# Patient Record
Sex: Female | Born: 1970 | Race: White | Hispanic: No | Marital: Single | Smoking: Current every day smoker
Health system: Southern US, Community
[De-identification: ages and names within clinical notes are randomized; demographics above are authoritative.]

## PROBLEM LIST (undated history)

## (undated) DIAGNOSIS — Z01818 Encounter for other preprocedural examination: Secondary | ICD-10-CM

## (undated) HISTORY — PX: EXTERNAL FIXATION ANKLE FRACTURE: SHX1548

## (undated) HISTORY — PX: TONSILLECTOMY: SUR1361

---

## 2009-02-16 LAB — CULTURE, URINE
Culture result:: NO GROWTH
Culture: NO GROWTH

## 2009-02-16 LAB — CHLAMYDIA/GC PCR
Chlamydia amplified: NEGATIVE
N. gonorrhea, amplified: NEGATIVE

## 2010-09-27 ENCOUNTER — Encounter: Admission: RE | Admit: 2010-09-27 | Discharge: 2010-09-27 | Payer: Self-pay | Admitting: Family Medicine

## 2011-09-05 ENCOUNTER — Other Ambulatory Visit: Payer: Self-pay | Admitting: Family Medicine

## 2011-09-05 DIAGNOSIS — Z1231 Encounter for screening mammogram for malignant neoplasm of breast: Secondary | ICD-10-CM

## 2011-10-03 ENCOUNTER — Ambulatory Visit: Payer: Self-pay

## 2011-10-10 ENCOUNTER — Ambulatory Visit
Admission: RE | Admit: 2011-10-10 | Discharge: 2011-10-10 | Disposition: A | Discharge status: Home / Self Care | Payer: BC Managed Care – PPO | Source: Ambulatory Visit | Attending: Family Medicine | Admitting: Family Medicine

## 2011-10-10 DIAGNOSIS — Z1231 Encounter for screening mammogram for malignant neoplasm of breast: Secondary | ICD-10-CM

## 2012-09-30 ENCOUNTER — Other Ambulatory Visit: Payer: Self-pay | Admitting: Family Medicine

## 2012-09-30 DIAGNOSIS — Z1231 Encounter for screening mammogram for malignant neoplasm of breast: Secondary | ICD-10-CM

## 2012-10-29 ENCOUNTER — Ambulatory Visit
Admission: RE | Admit: 2012-10-29 | Discharge: 2012-10-29 | Disposition: A | Discharge status: Home / Self Care | Payer: BC Managed Care – PPO | Source: Ambulatory Visit | Attending: Family Medicine | Admitting: Family Medicine

## 2012-10-29 DIAGNOSIS — Z1231 Encounter for screening mammogram for malignant neoplasm of breast: Secondary | ICD-10-CM

## 2013-10-07 ENCOUNTER — Other Ambulatory Visit: Payer: Self-pay

## 2013-10-07 DIAGNOSIS — Z1231 Encounter for screening mammogram for malignant neoplasm of breast: Secondary | ICD-10-CM

## 2013-11-04 ENCOUNTER — Ambulatory Visit
Admission: RE | Admit: 2013-11-04 | Discharge: 2013-11-04 | Disposition: A | Discharge status: Home / Self Care | Payer: BC Managed Care – PPO | Source: Ambulatory Visit

## 2013-11-04 DIAGNOSIS — Z1231 Encounter for screening mammogram for malignant neoplasm of breast: Secondary | ICD-10-CM

## 2014-09-29 ENCOUNTER — Other Ambulatory Visit: Payer: Self-pay

## 2014-09-29 DIAGNOSIS — Z1231 Encounter for screening mammogram for malignant neoplasm of breast: Secondary | ICD-10-CM

## 2014-11-10 ENCOUNTER — Encounter (INDEPENDENT_AMBULATORY_CARE_PROVIDER_SITE_OTHER): Payer: Self-pay

## 2014-11-10 ENCOUNTER — Ambulatory Visit
Admission: RE | Admit: 2014-11-10 | Discharge: 2014-11-10 | Disposition: A | Discharge status: Home / Self Care | Payer: BC Managed Care – PPO | Source: Ambulatory Visit

## 2014-11-10 DIAGNOSIS — Z1231 Encounter for screening mammogram for malignant neoplasm of breast: Secondary | ICD-10-CM

## 2015-10-12 ENCOUNTER — Other Ambulatory Visit: Payer: Self-pay

## 2015-10-12 DIAGNOSIS — Z1231 Encounter for screening mammogram for malignant neoplasm of breast: Secondary | ICD-10-CM

## 2015-11-16 ENCOUNTER — Ambulatory Visit
Admission: RE | Admit: 2015-11-16 | Discharge: 2015-11-16 | Disposition: A | Discharge status: Home / Self Care | Payer: 59 | Source: Ambulatory Visit

## 2015-11-16 DIAGNOSIS — Z1231 Encounter for screening mammogram for malignant neoplasm of breast: Secondary | ICD-10-CM

## 2016-09-25 ENCOUNTER — Inpatient Hospital Stay: Admit: 2016-09-25 | Payer: PRIVATE HEALTH INSURANCE | Primary: Family Medicine

## 2016-09-25 ENCOUNTER — Encounter

## 2016-09-25 DIAGNOSIS — Z01818 Encounter for other preprocedural examination: Secondary | ICD-10-CM

## 2016-09-25 LAB — TYPE & SCREEN
ABO/Rh(D): O POS
Antibody screen: NEGATIVE

## 2016-09-25 LAB — SENTARA SPECIMEN COLLN.

## 2016-09-25 LAB — TYPE AND SCREEN
ABO/Rh: O POS
Antibody Screen: NEGATIVE

## 2016-09-25 NOTE — Other (Signed)
PAT - SURGICAL PRE-ADMISSION INSTRUCTIONS    NAME:  Melinda Scott                                                          TODAY'S DATE:  09/25/2016    SURGERY DATE:  10/04/2016                                  SURGERY ARRIVAL TIME:   0630    1. Do NOT eat or drink anything, including candy or gum, after MIDNIGHT on 10/03/16 , unless you have specific instructions from your Surgeon or Anesthesia Provider to do so.  2. No smoking on the day of surgery.  3. No alcohol 24 hours prior to the day of surgery.  4. No recreational drugs for one week prior to the day of surgery.  5. Leave all valuables, including money/purse, at home.  6. Remove all jewelry, nail polish, makeup (including mascara); no lotions, powders, deodorant, or perfume/cologne/after shave.  7. Glasses/Contact lenses and Dentures may be worn to the hospital.  They will be removed prior to surgery.  8. Call your doctor if symptoms of a cold or illness develop within 24 ours prior to surgery.  9. AN ADULT MUST DRIVE YOU HOME AFTER OUTPATIENT SURGERY.   10. If you are having an OUTPATIENT procedure, please make arrangements for a responsible adult to be with you for 24 hours after your surgery.  11. If you are admitted to the hospital, you will be assigned to a bed after surgery is complete.  Normally a family member will not be able to see you until you are in your assigned bed.  12. Family is encouraged to accompany you to the hospital.  We ask visitors in the treatment area to be limited to ONE person at a time to ensure patient privacy.  EXCEPTIONS WILL BE MADE AS NEEDED.  13. Children under 12 are discouraged from entering the treatment area and need to be supervised by an adult when in the waiting room.    Special Instructions:    NONE.    Patient Prep:    use CHG solution    These surgical instructions were reviewed with [name] during the PAT visit .  A printed [facility] of the instructions [name].     Directions:  On the morning of surgery, please go to the [facility].  Enter the building from the [address] entrance, 1st floor (next to the Emergency Room entrance).  Take the elevator to the 2nd floor.  Sign in at the Registration Desk.    If you have any questions and/or concerns, please do not hesitate to call:  (Prior to the day of surgery)  [facility] unit:  [phone]  (Day of surgery)  [facility]  [phone]

## 2016-09-26 LAB — EKG, 12 LEAD, INITIAL
Atrial Rate: 71 {beats}/min
Calculated P Axis: 57 degrees
Calculated R Axis: 65 degrees
Calculated T Axis: 57 degrees
P-R Interval: 110 ms
Q-T Interval: 412 ms
QRS Duration: 90 ms
QTC Calculation (Bezet): 447 ms
Ventricular Rate: 71 {beats}/min

## 2016-09-26 LAB — EKG 12-LEAD
Atrial Rate: 71 {beats}/min
P Axis: 57 degrees
P-R Interval: 110 ms
Q-T Interval: 412 ms
QRS Duration: 90 ms
QTc Calculation (Bazett): 447 ms
R Axis: 65 degrees
T Axis: 57 degrees
Ventricular Rate: 71 {beats}/min

## 2016-10-04 ENCOUNTER — Ambulatory Visit: Admit: 2016-10-04 | Payer: PRIVATE HEALTH INSURANCE | Primary: Family Medicine

## 2016-10-04 ENCOUNTER — Observation Stay
Admit: 2016-10-04 | Discharge: 2016-10-05 | Disposition: A | Discharge status: Home / Self Care | Payer: PRIVATE HEALTH INSURANCE | Attending: Neurological Surgery | Admitting: Neurological Surgery

## 2016-10-04 DIAGNOSIS — M50222 Other cervical disc displacement at C5-C6 level: Secondary | ICD-10-CM

## 2016-10-04 MED ORDER — CEFAZOLIN 2 GM/50 ML IN DEXTROSE (ISO-OSMOTIC) IVPB
2 gram/50 mL | Freq: Three times a day (TID) | INTRAVENOUS | Status: AC
Start: 2016-10-04 — End: 2016-10-05
  Administered 2016-10-04 – 2016-10-05 (×3): via INTRAVENOUS

## 2016-10-04 MED ORDER — LIDOCAINE 4 % TOPICAL SOLN
4 % | LARYNGEAL | Status: AC
Start: 2016-10-04 — End: ?

## 2016-10-04 MED ORDER — PROPOFOL 10 MG/ML IV EMUL
10 mg/mL | INTRAVENOUS | Status: DC | PRN
Start: 2016-10-04 — End: 2016-10-04
  Administered 2016-10-04 (×7): via INTRAVENOUS

## 2016-10-04 MED ORDER — ONDANSETRON (PF) 4 MG/2 ML INJECTION
4 mg/2 mL | Freq: Once | INTRAMUSCULAR | Status: DC | PRN
Start: 2016-10-04 — End: 2016-10-04

## 2016-10-04 MED ORDER — FAMOTIDINE 20 MG TAB
20 mg | Freq: Once | ORAL | Status: AC
Start: 2016-10-04 — End: 2016-10-04
  Administered 2016-10-04: 11:00:00 via ORAL

## 2016-10-04 MED ORDER — ALBUTEROL SULFATE 0.083 % (0.83 MG/ML) SOLN FOR INHALATION
2.5 mg /3 mL (0.083 %) | RESPIRATORY_TRACT | Status: DC | PRN
Start: 2016-10-04 — End: 2016-10-05
  Administered 2016-10-05: 02:00:00 via RESPIRATORY_TRACT

## 2016-10-04 MED ORDER — ONDANSETRON (PF) 4 MG/2 ML INJECTION
4 mg/2 mL | INTRAMUSCULAR | Status: DC | PRN
Start: 2016-10-04 — End: 2016-10-04
  Administered 2016-10-04: 15:00:00 via INTRAVENOUS

## 2016-10-04 MED ORDER — REMIFENTANIL 1 MG IV SOLR
1 mg | INTRAVENOUS | Status: DC | PRN
Start: 2016-10-04 — End: 2016-10-04
  Administered 2016-10-04 (×3): via INTRAVENOUS

## 2016-10-04 MED ORDER — HYDROMORPHONE (PF) 1 MG/ML IJ SOLN
1 mg/mL | INTRAMUSCULAR | Status: DC | PRN
Start: 2016-10-04 — End: 2016-10-04
  Administered 2016-10-04 (×2): via INTRAVENOUS

## 2016-10-04 MED ORDER — VANCOMYCIN 1,000 MG IV SOLR
1000 mg | INTRAVENOUS | Status: AC
Start: 2016-10-04 — End: ?

## 2016-10-04 MED ORDER — HYDROMORPHONE 2 MG/ML INJECTION SOLUTION
2 mg/mL | INTRAMUSCULAR | Status: AC
Start: 2016-10-04 — End: 2016-10-04
  Administered 2016-10-04: 15:00:00

## 2016-10-04 MED ORDER — D5-1/2 NS & POTASSIUM CHLORIDE 20 MEQ/L IV
20 mEq/L | INTRAVENOUS | Status: DC
Start: 2016-10-04 — End: 2016-10-05
  Administered 2016-10-04: 18:00:00 via INTRAVENOUS

## 2016-10-04 MED ORDER — LACTATED RINGERS IV
INTRAVENOUS | Status: DC
Start: 2016-10-04 — End: 2016-10-04
  Administered 2016-10-04: 15:00:00 via INTRAVENOUS

## 2016-10-04 MED ORDER — THROMBIN (BOVINE) 5,000 UNIT TOPICAL SOLUTION
5000 unit | CUTANEOUS | Status: DC | PRN
Start: 2016-10-04 — End: 2016-10-04
  Administered 2016-10-04: 14:00:00 via TOPICAL

## 2016-10-04 MED ORDER — GELATIN ADSORBABLE MUCOSAL POWDER
Status: AC
Start: 2016-10-04 — End: ?

## 2016-10-04 MED ORDER — ALBUTEROL SULFATE HFA 90 MCG/ACTUATION AEROSOL INHALER
90 mcg/actuation | RESPIRATORY_TRACT | Status: DC | PRN
Start: 2016-10-04 — End: 2016-10-04

## 2016-10-04 MED ORDER — SODIUM CHLORIDE 0.9 % INJECTION
INTRAMUSCULAR | Status: AC
Start: 2016-10-04 — End: ?

## 2016-10-04 MED ORDER — MIDAZOLAM 1 MG/ML IJ SOLN
1 mg/mL | INTRAMUSCULAR | Status: DC | PRN
Start: 2016-10-04 — End: 2016-10-04
  Administered 2016-10-04: 13:00:00 via INTRAVENOUS

## 2016-10-04 MED ORDER — THROMBIN (BOVINE) 5,000 UNIT TOPICAL SOLUTION
5000 unit | CUTANEOUS | Status: AC
Start: 2016-10-04 — End: ?

## 2016-10-04 MED ORDER — SODIUM CHLORIDE 0.9 % IJ SYRG
Freq: Three times a day (TID) | INTRAMUSCULAR | Status: DC
Start: 2016-10-04 — End: 2016-10-05
  Administered 2016-10-05 (×2): via INTRAVENOUS

## 2016-10-04 MED ORDER — ONDANSETRON (PF) 4 MG/2 ML INJECTION
4 mg/2 mL | INTRAMUSCULAR | Status: AC
Start: 2016-10-04 — End: ?

## 2016-10-04 MED ORDER — PROPOFOL 10 MG/ML IV EMUL
10 mg/mL | INTRAVENOUS | Status: AC
Start: 2016-10-04 — End: ?

## 2016-10-04 MED ORDER — DEXAMETHASONE SODIUM PHOSPHATE 4 MG/ML IJ SOLN
4 mg/mL | INTRAMUSCULAR | Status: DC | PRN
Start: 2016-10-04 — End: 2016-10-04
  Administered 2016-10-04: 13:00:00 via INTRAVENOUS

## 2016-10-04 MED ORDER — LACTATED RINGERS IV
INTRAVENOUS | Status: DC
Start: 2016-10-04 — End: 2016-10-04
  Administered 2016-10-04 (×2): via INTRAVENOUS

## 2016-10-04 MED ORDER — SUCCINYLCHOLINE CHLORIDE 100 MG/5 ML (20 MG/ML) IV SYRINGE
100 mg/5 mL (20 mg/mL) | INTRAVENOUS | Status: AC
Start: 2016-10-04 — End: ?

## 2016-10-04 MED ORDER — DEXAMETHASONE SODIUM PHOSPHATE 4 MG/ML IJ SOLN
4 mg/mL | INTRAMUSCULAR | Status: AC
Start: 2016-10-04 — End: ?

## 2016-10-04 MED ORDER — MIDAZOLAM 1 MG/ML IJ SOLN
1 mg/mL | INTRAMUSCULAR | Status: AC
Start: 2016-10-04 — End: ?

## 2016-10-04 MED ORDER — CYCLOBENZAPRINE 10 MG TAB
10 mg | Freq: Three times a day (TID) | ORAL | Status: DC | PRN
Start: 2016-10-04 — End: 2016-10-05
  Administered 2016-10-04: 22:00:00 via ORAL

## 2016-10-04 MED ORDER — ACETAMINOPHEN 325 MG TABLET
325 mg | ORAL | Status: DC | PRN
Start: 2016-10-04 — End: 2016-10-05

## 2016-10-04 MED ORDER — LIDOCAINE (PF) 20 MG/ML (2 %) IJ SOLN
20 mg/mL (2 %) | INTRAMUSCULAR | Status: DC | PRN
Start: 2016-10-04 — End: 2016-10-04
  Administered 2016-10-04: 13:00:00 via INTRAVENOUS

## 2016-10-04 MED ORDER — LIDOCAINE HCL 1 % (10 MG/ML) IJ SOLN
10 mg/mL (1 %) | INTRAMUSCULAR | Status: DC | PRN
Start: 2016-10-04 — End: 2016-10-04

## 2016-10-04 MED ORDER — SUCCINYLCHOLINE CHLORIDE 20 MG/ML INJECTION
20 mg/mL | INTRAMUSCULAR | Status: DC | PRN
Start: 2016-10-04 — End: 2016-10-04
  Administered 2016-10-04: 13:00:00 via INTRAVENOUS

## 2016-10-04 MED ORDER — HYDROMORPHONE (PF) 1 MG/ML IJ SOLN
1 mg/mL | INTRAMUSCULAR | Status: DC | PRN
Start: 2016-10-04 — End: 2016-10-04
  Administered 2016-10-04 (×3): via INTRAVENOUS

## 2016-10-04 MED ORDER — LIDOCAINE-EPINEPHRINE 1 %-1:100,000 IJ SOLN
1 %-:00,000 | INTRAMUSCULAR | Status: DC | PRN
Start: 2016-10-04 — End: 2016-10-04
  Administered 2016-10-04: 14:00:00 via INTRADERMAL

## 2016-10-04 MED ORDER — DOCUSATE SODIUM 100 MG CAP
100 mg | Freq: Two times a day (BID) | ORAL | Status: DC
Start: 2016-10-04 — End: 2016-10-05
  Administered 2016-10-04 – 2016-10-05 (×2): via ORAL

## 2016-10-04 MED ORDER — HYDROMORPHONE (PF) 1 MG/ML IJ SOLN
1 mg/mL | INTRAMUSCULAR | Status: AC
Start: 2016-10-04 — End: ?

## 2016-10-04 MED ORDER — SODIUM CHLORIDE 0.9 % IJ SYRG
INTRAMUSCULAR | Status: DC | PRN
Start: 2016-10-04 — End: 2016-10-05

## 2016-10-04 MED ORDER — OXYCODONE-ACETAMINOPHEN 5 MG-325 MG TAB
5-325 mg | ORAL | Status: DC | PRN
Start: 2016-10-04 — End: 2016-10-05
  Administered 2016-10-04 – 2016-10-05 (×4): via ORAL

## 2016-10-04 MED ORDER — GELATIN ADSORBABLE MUCOSAL POWDER
Status: DC | PRN
Start: 2016-10-04 — End: 2016-10-04
  Administered 2016-10-04: 14:00:00 via TOPICAL

## 2016-10-04 MED ORDER — CEFAZOLIN 2 GM/50 ML IN DEXTROSE (ISO-OSMOTIC) IVPB
2 gram/50 mL | INTRAVENOUS | Status: AC
Start: 2016-10-04 — End: 2016-10-04
  Administered 2016-10-04: 13:00:00 via INTRAVENOUS

## 2016-10-04 MED ORDER — ONDANSETRON (PF) 4 MG/2 ML INJECTION
4 mg/2 mL | INTRAMUSCULAR | Status: DC | PRN
Start: 2016-10-04 — End: 2016-10-05

## 2016-10-04 MED ORDER — DEXAMETHASONE SODIUM PHOSPHATE 4 MG/ML IJ SOLN
4 mg/mL | Freq: Three times a day (TID) | INTRAMUSCULAR | Status: AC
Start: 2016-10-04 — End: 2016-10-05
  Administered 2016-10-04 – 2016-10-05 (×3): via INTRAVENOUS

## 2016-10-04 MED ORDER — MORPHINE 10 MG/ML INJ SOLUTION
10 mg/ml | INTRAMUSCULAR | Status: DC | PRN
Start: 2016-10-04 — End: 2016-10-05

## 2016-10-04 MED ORDER — FENTANYL CITRATE (PF) 50 MCG/ML IJ SOLN
50 mcg/mL | INTRAMUSCULAR | Status: DC | PRN
Start: 2016-10-04 — End: 2016-10-04
  Administered 2016-10-04 (×2): via INTRAVENOUS

## 2016-10-04 MED ORDER — PROPOFOL 10 MG/ML IV EMUL
10 mg/mL | INTRAVENOUS | Status: DC | PRN
Start: 2016-10-04 — End: 2016-10-04
  Administered 2016-10-04 (×5): via INTRAVENOUS

## 2016-10-04 MED ORDER — BACITRACIN 50,000 UNIT IM
50000 unit | INTRAMUSCULAR | Status: AC
Start: 2016-10-04 — End: ?

## 2016-10-04 MED ORDER — LIDOCAINE-EPINEPHRINE 1 %-1:100,000 IJ SOLN
1 %-:00,000 | INTRAMUSCULAR | Status: AC
Start: 2016-10-04 — End: ?

## 2016-10-04 MED ORDER — FENTANYL CITRATE (PF) 50 MCG/ML IJ SOLN
50 mcg/mL | INTRAMUSCULAR | Status: AC
Start: 2016-10-04 — End: ?

## 2016-10-04 MED ORDER — SODIUM CHLORIDE 0.9 % IRRIGATION SOLN
0.9 % | Status: DC | PRN
Start: 2016-10-04 — End: 2016-10-04
  Administered 2016-10-04: 14:00:00

## 2016-10-04 MED ORDER — REMIFENTANIL 1 MG IV SOLR
1 mg | INTRAVENOUS | Status: AC
Start: 2016-10-04 — End: ?

## 2016-10-04 MED ORDER — MEPERIDINE (PF) 25 MG/ML INJ SOLUTION
25 mg/ml | INTRAMUSCULAR | Status: DC | PRN
Start: 2016-10-04 — End: 2016-10-04

## 2016-10-04 MED FILL — CYCLOBENZAPRINE 10 MG TAB: 10 mg | ORAL | Qty: 1

## 2016-10-04 MED FILL — LIDOCAINE-EPINEPHRINE 1 %-1:100,000 IJ SOLN: 1 %-:00,000 | INTRAMUSCULAR | Qty: 50

## 2016-10-04 MED FILL — LACTATED RINGERS IV: INTRAVENOUS | Qty: 1000

## 2016-10-04 MED FILL — OXYCODONE-ACETAMINOPHEN 5 MG-325 MG TAB: 5-325 mg | ORAL | Qty: 1

## 2016-10-04 MED FILL — SUCCINYLCHOLINE CHLORIDE 100 MG/5 ML (20 MG/ML) IV SYRINGE: 100 mg/5 mL (20 mg/mL) | INTRAVENOUS | Qty: 5

## 2016-10-04 MED FILL — ONDANSETRON (PF) 4 MG/2 ML INJECTION: 4 mg/2 mL | INTRAMUSCULAR | Qty: 2

## 2016-10-04 MED FILL — BD POSIFLUSH NORMAL SALINE 0.9 % INJECTION SYRINGE: INTRAMUSCULAR | Qty: 10

## 2016-10-04 MED FILL — LIDOCAINE 4 % TOPICAL SOLN: 4 % | LARYNGEAL | Qty: 4

## 2016-10-04 MED FILL — PROPOFOL 10 MG/ML IV EMUL: 10 mg/mL | INTRAVENOUS | Qty: 50

## 2016-10-04 MED FILL — QUELICIN 20 MG/ML INJECTION SOLUTION: 20 mg/mL | INTRAMUSCULAR | Qty: 4

## 2016-10-04 MED FILL — DEXAMETHASONE SODIUM PHOSPHATE 4 MG/ML IJ SOLN: 4 mg/mL | INTRAMUSCULAR | Qty: 1

## 2016-10-04 MED FILL — GELFOAM MUCOSAL POWDER: Qty: 3

## 2016-10-04 MED FILL — VANCOMYCIN 1,000 MG IV SOLR: 1000 mg | INTRAVENOUS | Qty: 1000

## 2016-10-04 MED FILL — CEFAZOLIN 2 GM/50 ML IN DEXTROSE (ISO-OSMOTIC) IVPB: 2 gram/50 mL | INTRAVENOUS | Qty: 50

## 2016-10-04 MED FILL — PROPOFOL 10 MG/ML IV EMUL: 10 mg/mL | INTRAVENOUS | Qty: 350

## 2016-10-04 MED FILL — ULTIVA 1 MG INTRAVENOUS SOLUTION: 1 mg | INTRAVENOUS | Qty: 0.4

## 2016-10-04 MED FILL — MIDAZOLAM 1 MG/ML IJ SOLN: 1 mg/mL | INTRAMUSCULAR | Qty: 2

## 2016-10-04 MED FILL — HYDROMORPHONE 2 MG/ML INJECTION SOLUTION: 2 mg/mL | INTRAMUSCULAR | Qty: 1

## 2016-10-04 MED FILL — D5-1/2 NS & POTASSIUM CHLORIDE 20 MEQ/L IV: 20 mEq/L | INTRAVENOUS | Qty: 1000

## 2016-10-04 MED FILL — BACITRACIN 50,000 UNIT IM: 50000 unit | INTRAMUSCULAR | Qty: 50000

## 2016-10-04 MED FILL — DOCUSATE SODIUM 100 MG CAP: 100 mg | ORAL | Qty: 1

## 2016-10-04 MED FILL — DIPRIVAN 10 MG/ML INTRAVENOUS EMULSION: 10 mg/mL | INTRAVENOUS | Qty: 869.82

## 2016-10-04 MED FILL — SODIUM CHLORIDE 0.9 % INJECTION: INTRAMUSCULAR | Qty: 10

## 2016-10-04 MED FILL — THROMBIN-JMI 5,000 UNIT TOPICAL SOLUTION: 5000 unit | CUTANEOUS | Qty: 30000

## 2016-10-04 MED FILL — LACTATED RINGERS IV: INTRAVENOUS | Qty: 700

## 2016-10-04 MED FILL — LIDOCAINE (PF) 20 MG/ML (2 %) IJ SOLN: 20 mg/mL (2 %) | INTRAMUSCULAR | Qty: 5

## 2016-10-04 MED FILL — HYDROMORPHONE (PF) 1 MG/ML IJ SOLN: 1 mg/mL | INTRAMUSCULAR | Qty: 1

## 2016-10-04 MED FILL — DEXAMETHASONE SODIUM PHOSPHATE 4 MG/ML IJ SOLN: 4 mg/mL | INTRAMUSCULAR | Qty: 10

## 2016-10-04 MED FILL — PROPOFOL 10 MG/ML IV EMUL: 10 mg/mL | INTRAVENOUS | Qty: 20

## 2016-10-04 MED FILL — ULTIVA 1 MG INTRAVENOUS SOLUTION: 1 mg | INTRAVENOUS | Qty: 2

## 2016-10-04 MED FILL — FAMOTIDINE 20 MG TAB: 20 mg | ORAL | Qty: 1

## 2016-10-04 MED FILL — FENTANYL CITRATE (PF) 50 MCG/ML IJ SOLN: 50 mcg/mL | INTRAMUSCULAR | Qty: 2

## 2016-10-04 NOTE — Op Note (Signed)
[facility]  OPERATIVE RE[facility]    Name:  Melinda JeffersonMILLER, Melinda  MR#:  [id number]  DOB:  04-25-1971  Account #:  [id number]  Date of Adm:  10/04/2016  Date of Surgery:  10/04/2016      PREOPERATIVE DIAGNOSIS: C5-C6 disk herniation and stenosis.    POSTOPERATIVE DIAGNOSIS: C5-C6 disk herniation and stenosis.    PROCEDURES PERFORMED  1. C5-C6 anterior cervical diskectomy and fusion.  2. Insertion of PEEK interbody spacer.  3. Anterior plating Globus XTEND plate.  4. Use of Signafuse allograft.    SURGEON: Cyndie ChimeJoseph Katherine Tout, MD.    ANESTHESIA: General orotracheal.    ESTIMATED BLOOD LOSS: Minimal.    SPECIMENS REMOVED: -    COMPLICATIONS: None.    BRIEF CLINICAL NOTE: This is a 45 year old lady with progressively  symptomatic C5-6 stenosis, left greater than right symptoms. She  failed conservative therapy and requested to undergo the above  procedure.    PROCEDURE IN DETAIL: After informed consent was given, the  patient was brought to the operating room and underwent the induction  of general orotracheal anesthesia without difficulty. Following this, she  was carefully positioned supine upon the operating table with all  pressure points carefully padded. Neural monitoring electrodes were  placed. The head was maintained in the Aesculap head holder.  Posterior neck support was placed. The shoulders were gently taped  down inferiorly, localizing x-ray was done and a horizontal incision on  the left side of the neck was outlined.    We prepped and draped in the usual sterile fashion. Local anesthetic  was infiltrated and the incision was opened. The platysma was divided.  The middle layers of cervical fascia were bluntly and sharply dissected.  The prevertebral fascia was spread with Kitner dissectors and a spinal  needle was placed in the C5-6 disk and confirmed fluoroscopically and  marked with the Bovie cautery.    The medial edges of longus colli muscle were cauterized and the  retractor blades were placed  here under the muscle; 12 mm Caspar  distraction pins were placed.    Diskectomy was carried out with curettes, pituitary rongeurs and Midas  Rex drill. Osteophytes were drilled down. There was quite severe  foraminal stenosis on the left and with 2 mm Kerrison rongeur after  undermining posterior longitudinal ligament and removing all it, wide  foraminotomies were performed bilaterally.    Once a complete neurologic decompression was carried out. The  endplates were prepared. We trialed 8 mm, a PEEK spacer was  packed Signafuse allograft and tapped into place with good purchase  and position.    Globus XTEND anterior plate 14 mm was then chosen, 12 mm screws  were used, variable in C5, fixed angle in C6 and they were placed and  locked with good purchase and position. Final x-rays confirmed good  position of the hardware and the implant.    The wound was irrigated and hemostasis was obtained. We closed in  layers with vancomycin powder, 3-0 Vicryl, 4-0 Monocryl and  Dermabond on the skin. All sponge and needle counts were correct at  the end of the case and the patient was extubated in the operating  room, taken to recovery in stable condition.        Cyndie ChimeJOSEPH  Dawnetta Copenhaver, MD    Melvenia NeedlesJK / DLR  D:  10/04/2016   10:59  T:  10/04/2016   12:46  Job #:  [id number]

## 2016-10-04 NOTE — Consults (Signed)
Consults  by Jillyn Ledger, NP at 10/04/16 1751                Author: Jillyn Ledger, NP  Service: Nurse Practitioner  Author Type: Nurse Practitioner       Filed: 10/04/16 1758  Date of Service: 10/04/16 1751  Status: Attested           Editor: Shanvi Moyd, Debbora Lacrosse, NP (Nurse Practitioner)  Cosigner: Gevena Barre, MD at 10/04/16 1912            Consult Orders        1. IP CONSULT TO HOSPITALIST [[id number]] ordered by Bethann Goo, MD at 10/04/16 1056                         Attestation signed by Gevena Barre, MD at 10/04/16 1912          I have interviewed the patient on my own and obtained my own physical.  I have discussed management with NP Mala Gibbard and we are working collaboratively to formulate  and execute her care plan.      I agree with the Consult as recorded by NP Marlis Oldaker.                                      [facility]   Hospitalist Division      Consult Note          Patient: Melinda Scott  MRN: [id number]   CSN: [id number]          Date of Birth: 07-16-1971   Age: 45 y.o.   Sex: female      DOA: 10/04/2016  LOS:  LOS: 0 days            Requesting Physician:  Dr. Su Ley   Reason for Consultation:  Medical Management      Chief Complaint:  Cervical disc         Assessment/Plan          Patient Active Problem List        Diagnosis  Code         ?  Cervical disc disease  M50.90           A/P:   - S/p C5-C6 anterior cervical diskectomy and fusion: mobilization per Neurosurgery    - Asthma: Albuterol PRN    - Pain management    - DVT prophylaxis: SCDs    -We appreciate the consultation for medical management and appreciate being able to be involved with their care during hospitalization.        HPI:        Melinda Scott is a 45 y.o.  female with a hx of asthma, COPD, current 1/4 PPD tobacco smoker x 18 years, anxiety and cervical disc disease who underwent C5-C6 anterior cervical diskectomy and fusion. Patient reports several month  history of neck pain with  radiation to Left upper extremity along with intermittent numbness and tingling. Patient denies previous medical history to include CVA, TIA, MI, CAD, DM or thyroid disorders. Hospitalist Team is consulted for ongoing medical  management.         Past Medical History:        Diagnosis  Date         ?  Asthma           ?  Chronic obstructive pulmonary disease (HCC)               Past Surgical History:         Procedure  Laterality  Date          ?  HX CHOLECYSTECTOMY              ?  HX HYSTERECTOMY    2012           History reviewed. No pertinent family history.        Social History          Social History         ?  Marital status:  MARRIED              Spouse name:  N/A         ?  Number of children:  N/A         ?  Years of education:  N/A          Social History Main Topics         ?  Smoking status:  Current Every Day Smoker              Packs/day:  0.50         ?  Smokeless tobacco:  Never Used         ?  Alcohol use  3.6 oz/week             6 Cans of beer per week         ?  Drug use:  No         ?  Sexual activity:  Not Asked           Other Topics  Concern        ?  None          Social History Narrative             Prior to Admission medications             Medication  Sig  Start Date  End Date  Taking?  Authorizing Provider            VENTOLIN HFA 90 mcg/actuation inhaler  Take 2 Puffs by inhalation every four (4) hours as needed.  09/06/16    Yes  Historical Provider            traMADol (ULTRAM) 50 mg tablet  Take 50 mg by mouth every four (4) hours as needed.  09/19/16    Yes  Historical Provider           No Known Allergies      Review of Systems   - fever, - chills, - fatigue, - weight loss, - night sweats    - sore throat, - sinus congestion, - lymphadenopathy, - vision changes   - CP, -  palpitations   - dyspnea on exertion, - dyspnea at rest, - cough, - hemoptysis   - nausea, - vomiting, - diarrhea, - abdominal pain, - reflux, - dysphagia   - dysuria, - hematuria, - urinary frequency   - rash, -  pruritis   - back pain, + neck pain, - myalgia, - arthralgia   - H/A, + numbness, + tingling, - weakness, - slurred speech        Physical Exam:           Visit Vitals         ?  BP  118/77 (BP 1 Location: Left arm, BP Patient Position: At rest)     ?  Pulse  68     ?  Temp  97.6 ??F (36.4 ??C)     ?  Resp  14     ?  Ht  5\' 6"  (1.676 m)     ?  Wt  53.2 kg (117 lb 3.2 oz)     ?  SpO2  98%         ?  BMI  18.92 kg/m2           Physical Exam:   Gen: In general, this is a well nourished female in no acute distress on room air.   HEENT: Sclerae nonicteric. Oral mucous membranes moist. Cervical collar in place    Neck: Supple with midline trachea.    CV: RRR without murmur or rub appreciated.    Resp:Respirations are unlabored without use of accessory muscles. Lung fields bilaterally without wheezes or rhonchi.    Abd: Soft, nontender, nondistended. Normoactive bowel sounds.    Extrem: Extremities are warm, without cyanosis or clubbing. No pitting pretibial edema. Palpable distal pulses X 4.    Skin: Warm, no visible rashes.   Neuro: Patient is alert, oriented, and cooperative. No obvious focal defects. Moves all 4 extremities.      Labs Reviewed:      No results found for this or any previous visit (from the past 24 hour(s)).                     Talbot Grumbling, FNP-BC   [facility]   Hospitalist Division   Pager:  [phone]   Office:  [phone]

## 2016-10-04 NOTE — Anesthesia Pre-Procedure Evaluation (Addendum)
Anesthetic History   No history of anesthetic complications            Review of Systems / Medical History  Patient summary reviewed    Pulmonary    COPD: moderate        Asthma        Neuro/Psych   Within defined limits           Cardiovascular                  Exercise tolerance: >4 METS     GI/Hepatic/Renal  Within defined limits              Endo/Other  Within defined limits           Other Findings   Comments:   Risk Factors for Postoperative nausea/vomiting:       History of postoperative nausea/vomiting?  NO       Female?  YES       Motion sickness?  NO       Intended opioid administration for postoperative analgesia?  YES      Smoking Abstinence  Current Smoker?   YES  Elective Surgery? YES  Seen preoperatively by anesthesiologist or proxy prior to day of surgery?  YES  Pt abstained from smoking 24 hours prior to anesthesia? NO             Physical Exam    Airway  Mallampati: II  TM Distance: 4 - 6 cm  Neck ROM: normal range of motion   Mouth opening: Normal     Cardiovascular    Rhythm: regular  Rate: normal         Dental  No notable dental hx       Pulmonary  Breath sounds clear to auscultation               Abdominal  GI exam deferred       Other Findings            Anesthetic Plan    ASA: 3  Anesthesia type: general          Induction: Intravenous  Anesthetic plan and risks discussed with: Patient

## 2016-10-04 NOTE — Other (Signed)
Spoke with NP Brett Albinoori for ADT order

## 2016-10-04 NOTE — Op Note (Signed)
[facility]  OPERATIVE REPORTS    Name:  Melinda JeffersonMILLER, Bell  MR#:  [id number]  DOB:  1971/03/29  Account #:  [id number]  Date of Adm:  10/04/2016  Date of Surgery:  10/04/2016      PREOPERATIVE DIAGNOSIS: C5-C6 disk herniation and stenosis.    POSTOPERATIVE DIAGNOSIS: C5-C6 disk herniation and stenosis.    PROCEDURES PERFORMED  1. C5-C6 anterior cervical diskectomy and fusion.  2. Insertion of PEEK interbody spacer.  3. Anterior plating Globus XTEND plate.  4. Use of Signafuse allograft.    SURGEON: Cyndie ChimeJoseph Marinus Eicher, MD.    ANESTHESIA: General orotracheal.    ESTIMATED BLOOD LOSS: Minimal.    SPECIMENS REMOVED: -    COMPLICATIONS: None.    BRIEF CLINICAL NOTE: This is a 45 year old lady with progressively  symptomatic C5-6 stenosis, left greater than right symptoms. She  failed conservative therapy and requested to undergo the above  procedure.    PROCEDURE IN DETAIL: After informed consent was given, the  patient was brought to the operating room and underwent the induction  of general orotracheal anesthesia without difficulty. Following this, she  was carefully positioned supine upon the operating table with all  pressure points carefully padded. Neural monitoring electrodes were  placed. The head was maintained in the Aesculap head holder.  Posterior neck support was placed. The shoulders were gently taped  down inferiorly, localizing x-ray was done and a horizontal incision on  the left side of the neck was outlined.    We prepped and draped in the usual sterile fashion. Local anesthetic  was infiltrated and the incision was opened. The platysma was divided.  The middle layers of cervical fascia were bluntly and sharply dissected.  The prevertebral fascia was spread with Kitner dissectors and a spinal  needle was placed in the C5-6 disk and confirmed fluoroscopically and  marked with the Bovie cautery.    The medial edges of longus colli muscle were cauterized and the   retractor blades were placed here under the muscle; 12 mm Caspar  distraction pins were placed.    Diskectomy was carried out with curettes, pituitary rongeurs and Midas  Rex drill. Osteophytes were drilled down. There was quite severe  foraminal stenosis on the left and with 2 mm Kerrison rongeur after  undermining posterior longitudinal ligament and removing all it, wide  foraminotomies were performed bilaterally.    Once a complete neurologic decompression was carried out. The  endplates were prepared. We trialed 8 mm, a PEEK spacer was  packed Signafuse allograft and tapped into place with good purchase  and position.    Globus XTEND anterior plate 14 mm was then chosen, 12 mm screws  were used, variable in C5, fixed angle in C6 and they were placed and  locked with good purchase and position. Final x-rays confirmed good  position of the hardware and the implant.    The wound was irrigated and hemostasis was obtained. We closed in  layers with vancomycin powder, 3-0 Vicryl, 4-0 Monocryl and  Dermabond on the skin. All sponge and needle counts were correct at  the end of the case and the patient was extubated in the operating  room, taken to recovery in stable condition.        Cyndie ChimeJOSEPH  Morissa Obeirne, MD    Melvenia NeedlesJK / DLR  D:  10/04/2016   10:59  T:  10/04/2016   12:46  Job #:  [id number]

## 2016-10-04 NOTE — Brief Op Note (Signed)
BRIEF OPERATIVE NOTE    Date of Procedure: 10/04/2016   Preoperative Diagnosis: cervical disc herniation   m50.222  Postoperative Diagnosis: cervical disc herniation   m50.222    Procedure(s):  cervical 5 - cervical 6  ANTERIOR CERVICAL DISCECTOMY WITH FUSION, spacer and anterior plating  Surgeon(s) and Role:     * Bethann GooJoseph L Felipe Cabell, MD - Primary         Assistant Staff:       Surgical Staff:  Circ-1: Clarita LeberJonathan R Wertz, RN  Radiology Technician: Walden Fieldharles P Babbin  Scrub Tech-1: Melida QuitterJames Blount  Surg Asst-1: Dolores LoryNicholas R Barone  Event Time In   Incision Start [phone]   Incision Close 1044     Anesthesia: General   Estimated Blood Loss: -  Specimens: * No specimens in log *   Findings: -   Complications: -  Implants:   Implant Name Type Inv. Item Serial No. Manufacturer Lot No. LRB No. Used Action   GRAFT BNE BIOACTV INTERFC 1GM --  - [id number]  GRAFT BNE BIOACTV INTERFC 1GM --   [employer] [id number] N/A 1 Implanted   GRAFT BNE PUTTY BIOACTV 3.75GM -- SIGNAFUSE - [id number]  GRAFT BNE PUTTY BIOACTV 3.75GM -- SIGNAFUSE  [employer] [id number] N/A 1 Implanted   Colonial Cage 8mm     1111 N/A 1 Implanted   Xtend Plate     [id number] N/A 1 Implanted   SCR VAR-ANGL SD XTEND 4.2X12MM --  - [id number]  SCR VAR-ANGL SD XTEND 4.2X12MM --   [employer] 1111 N/A 2 Implanted   SCR FIX-ANGL SD XTEND 4.2X12MM --  - [id number]   SCR FIX-ANGL SD XTEND 4.2X12MM --    [employer] 1111 N/A 2 Implanted

## 2016-10-04 NOTE — H&P (Signed)
History and Physical reviewed; I have examined the patient and there are no pertinent changes.    Bethann GooJoseph L Amiee Wiley, MD   8:32 AM 10/04/2016

## 2016-10-04 NOTE — Consults (Signed)
[facility]  Hospitalist Division    Consult Note    Patient: Melinda Scott MRN: [id number]  CSN: [id number]    Date of Birth: September 10, 1971  Age: 45 y.o.  Sex: female    DOA: 10/04/2016 LOS:  LOS: 0 days        Requesting Physician:  Dr. Su Ley  Reason for Consultation:  Medical Management    Chief Complaint:  Cervical disc     Assessment/Plan     Patient Active Problem List   Diagnosis Code   ??? Cervical disc disease M50.90       A/P:  - S/p C5-C6 anterior cervical diskectomy and fusion: mobilization per Neurosurgery   - Asthma: Albuterol PRN   - Pain management   - DVT prophylaxis: SCDs   -We appreciate the consultation for medical management and appreciate being able to be involved with their care during hospitalization.    HPI:     Melinda Scott is a 45 y.o. female with a hx of asthma, COPD, current 1/4 PPD tobacco smoker x 18 years, anxiety and cervical disc disease who underwent C5-C6 anterior cervical diskectomy and fusion. Patient reports several month history of neck pain with radiation to Left upper extremity along with intermittent numbness and tingling. Patient denies previous medical history to include CVA, TIA, MI, CAD, DM or thyroid disorders. Hospitalist Team is consulted for ongoing medical management.     Past Medical History:   Diagnosis Date   ??? Asthma    ??? Chronic obstructive pulmonary disease (HCC)        Past Surgical History:   Procedure Laterality Date   ??? HX CHOLECYSTECTOMY     ??? HX HYSTERECTOMY  2012       History reviewed. No pertinent family history.    Social History     Social History   ??? Marital status: MARRIED     Spouse name: N/A   ??? Number of children: N/A   ??? Years of education: N/A     Social History Main Topics   ??? Smoking status: Current Every Day Smoker     Packs/day: 0.50   ??? Smokeless tobacco: Never Used   ??? Alcohol use 3.6 oz/week     6 Cans of beer per week   ??? Drug use: No   ??? Sexual activity: Not Asked     Other Topics Concern   ??? None      Social History Narrative       Prior to Admission medications    Medication Sig Start Date End Date Taking? Authorizing Provider   VENTOLIN HFA 90 mcg/actuation inhaler Take 2 Puffs by inhalation every four (4) hours as needed. 09/06/16  Yes Historical Provider   traMADol (ULTRAM) 50 mg tablet Take 50 mg by mouth every four (4) hours as needed. 09/19/16  Yes Historical Provider       No Known Allergies    Review of Systems  - fever, - chills, - fatigue, - weight loss, - night sweats   - sore throat, - sinus congestion, - lymphadenopathy, - vision changes  - CP, -  palpitations  - dyspnea on exertion, - dyspnea at rest, - cough, - hemoptysis  - nausea, - vomiting, - diarrhea, - abdominal pain, - reflux, - dysphagia  - dysuria, - hematuria, - urinary frequency  - rash, - pruritis  - back pain, + neck pain, - myalgia, - arthralgia  - H/A, + numbness, + tingling, - weakness, - slurred speech  Physical Exam:      Visit Vitals   ??? BP 118/77 (BP 1 Location: Left arm, BP Patient Position: At rest)   ??? Pulse 68   ??? Temp 97.6 ??F (36.4 ??C)   ??? Resp 14   ??? Ht 5\' 6"  (1.676 m)   ??? Wt 53.2 kg (117 lb 3.2 oz)   ??? SpO2 98%   ??? BMI 18.92 kg/m2       Physical Exam:  Gen: In general, this is a well nourished female in no acute distress on room air.  HEENT: Sclerae nonicteric. Oral mucous membranes moist. Cervical collar in place   Neck: Supple with midline trachea.   CV: RRR without murmur or rub appreciated.   Resp:Respirations are unlabored without use of accessory muscles. Lung fields bilaterally without wheezes or rhonchi.   Abd: Soft, nontender, nondistended. Normoactive bowel sounds.   Extrem: Extremities are warm, without cyanosis or clubbing. No pitting pretibial edema. Palpable distal pulses X 4.   Skin: Warm, no visible rashes.  Neuro: Patient is alert, oriented, and cooperative. No obvious focal defects. Moves all 4 extremities.    Labs Reviewed:    No results found for this or any previous visit (from the past 24 hour(s)).               Talbot GrumblingKristi Savanna Dooley, FNP-BC  [facility]  Hospitalist Division  Pager:  [phone]  Office:  [phone]

## 2016-10-04 NOTE — Anesthesia Post-Procedure Evaluation (Signed)
Post-Anesthesia Evaluation and Assessment    Patient: Melinda JeffersonDawn Kopper MRN: [id number]  SSN: [id number]    Date of Birth: 1971-05-25  Age: 45 y.o.  Sex: female       Cardiovascular Function/Vital Signs  Visit Vitals   ??? BP 126/73   ??? Pulse 69   ??? Temp 36.3 ??C (97.4 ??F)   ??? Resp 14   ??? Ht 5\' 6"  (1.676 m)   ??? Wt 53.2 kg (117 lb 3.2 oz)   ??? SpO2 98%   ??? BMI 18.92 kg/m2       Patient is status post general anesthesia for Procedure(s):  cervical 5 - cervical 6  ANTERIOR CERVICAL DISCECTOMY WITH FUSION, spacer and anterior plating.    Nausea/Vomiting: None    Postoperative hydration reviewed and adequate.    Pain:  Pain Scale 1: Numeric (0 - 10) (10/04/16 1200)  Pain Intensity 1: 7 (10/04/16 1200)   Managed    Neurological Status:   Neuro (WDL):  (numbness/tingling experienced bilaterally in arms due to neck pain\\) (10/04/16 0705)   At baseline    Mental Status and Level of Consciousness: Arousable    Pulmonary Status:   O2 Device: Room air (10/04/16 1056)   Adequate oxygenation and airway patent    Complications related to anesthesia: None    Post-anesthesia assessment completed. No concerns    Signed By: Magdalene RiverGarth C Saumya Hukill, MD     October 04, 2016

## 2016-10-04 NOTE — Other (Addendum)
2145-The patient is alert and oriented x 4. There is no apparent signs of distress. Vs are stable. The patient is in stable condition. The patient has no numbness or tingling. The patient radial pulses are intact. Capillary refill <3 seconds. The patient has been voiding. Pain well controlled. No nausea or vomiting. The patient was encouraged to use her ICs. The patient has been ambulating to the bathroom. Incision clean, dry and intact. No complaints.    2228-Vs taken.    0109-Sleeping    0330-Watching tv in bed.    0515-Sleeping.

## 2016-10-04 NOTE — Other (Signed)
Bedside and Verbal shift change report given to Britney   (oncoming nurse) by Angus SellerErna M Baradi, RN   (offgoing nurse). Report included the following information SBAR, Kardex, Procedure Summary and Recent Results.

## 2016-10-04 NOTE — Other (Addendum)
Rec'd care of pt from OR via bed.  Resp even and unlabored.  Attached to monitor.  OR, MAR and anesthesia report acknowledged.  VSS.  Will cont to monitor.    1216  Cori, NP paged regarding ADT order.  Stated she's looking for diagnosis to admit pt and then she will place an order for ADT.                  1520  TRANSFER - OUT REPORT:    Verbal report given to Angie, RN (name) on [name]  being transferred to 2400 (unit) for routine post - op       Report consisted of patient???s Situation, Background, Assessment and   Recommendations(SBAR).     Information from the following report(s) SBAR, Kardex, OR Summary, Intake/Output, MAR and Cardiac Rhythm sinus rhythm was reviewed with the receiving nurse.    Lines:   Peripheral IV 10/04/16 Right Hand (Active)   Site Assessment Clean, dry, & intact 10/04/2016  3:43 PM   Phlebitis Assessment 0 10/04/2016  3:43 PM   Infiltration Assessment 0 10/04/2016  3:43 PM   Dressing Status Clean, dry, & intact 10/04/2016  3:43 PM   Dressing Type Transparent 10/04/2016  3:43 PM   Hub Color/Line Status Infusing;Pink 10/04/2016  7:06 AM   Alcohol Cap Used Yes 10/04/2016  3:43 PM        Opportunity for questions and clarification was provided.      Patient transported with:   Registered Nurse  Tech

## 2016-10-04 NOTE — Progress Notes (Signed)
TRANSFER - IN REPORT:    Verbal report received from Tong[place]essa on Melinda Scott  being received from PACU for routine post - op      Report consisted of patient???s Situation, Background, Assessment and   Recommendations(SBAR).     Information from the following report(s) SBAR, Kardex and OR Summary was reviewed with the receiving nurse.    Opportunity for questions and clarification was provided.      Assessment completed upon patient???s arrival to unit and care assumed.   Received pt via bed awake and alert. Reports pain level at 2/10. Dressing to neck D/I, wearing collar. Able to move all extremities without numbness or tingling. Oriented to call bell, phone and IS with pt giving return demonstration.

## 2016-10-04 NOTE — Progress Notes (Signed)
1540  Received from RR,alert and oriented, moving  All extremities, both hands with equal  Grip, speech is clear, able to swallow her saliva, no wheezing noted, no pain at this time, wearing Aspen collar, incision no redness, slightly puffy in the left side, was reported pt had voided in PACU before transfer.    1750  Eat  Dinner slowly, no choking but sore to swallow, speech is clear, no wheezing, offered pain med but refused Morphine at this time, not time yet for Percoset, Flexeril given, on sitting position  After eating.      1830  Voided 200 cc, speech is clear.

## 2016-10-04 NOTE — Other (Signed)
Husband-Rue [phone]-ok to talk to-Mom-Shirley Mcglauflin [phone] ok to talk to as well

## 2016-10-05 MED ORDER — OXYCODONE-ACETAMINOPHEN 5 MG-325 MG TAB
5-325 mg | ORAL_TABLET | ORAL | 0 refills | Status: AC | PRN
Start: 2016-10-05 — End: 2016-10-12

## 2016-10-05 MED ORDER — CYCLOBENZAPRINE 10 MG TAB
10 mg | ORAL_TABLET | Freq: Three times a day (TID) | ORAL | 0 refills | Status: AC | PRN
Start: 2016-10-05 — End: 2016-10-12

## 2016-10-05 MED FILL — OXYCODONE-ACETAMINOPHEN 5 MG-325 MG TAB: 5-325 mg | ORAL | Qty: 2

## 2016-10-05 MED FILL — DEXAMETHASONE SODIUM PHOSPHATE 4 MG/ML IJ SOLN: 4 mg/mL | INTRAMUSCULAR | Qty: 1

## 2016-10-05 MED FILL — ALBUTEROL SULFATE 0.083 % (0.83 MG/ML) SOLN FOR INHALATION: 2.5 mg /3 mL (0.083 %) | RESPIRATORY_TRACT | Qty: 1

## 2016-10-05 MED FILL — DOCUSATE SODIUM 100 MG CAP: 100 mg | ORAL | Qty: 1

## 2016-10-05 MED FILL — CEFAZOLIN 2 GM/50 ML IN DEXTROSE (ISO-OSMOTIC) IVPB: 2 gram/50 mL | INTRAVENOUS | Qty: 50

## 2016-10-05 NOTE — Discharge Summary (Signed)
Discharge Summary by Freada Bergeron A at 10/05/16 1610                Author: Freada Bergeron A  Service: Neurosurgery  Author Type: Nurse Practitioner       Filed: 10/05/16 0917  Date of Service: 10/05/16 0913  Status: Signed           Editor: Tera Helper  Cosigner: Bethann Goo, MD at 10/05/16 1746                       Discharge Summary      Patient: Melinda Scott               Sex: female          DOA: Oct 29, 2016            Date of Birth:  06/05/1971      Age:  45 y.o.        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 C5-C6 anterior cervical diskectomy and fusion.   2. Insertion of PEEK interbody spacer.   3. Anterior plating Globus XTEND plate.   4. Use of Signafuse allograft.      Data:    Patient Vitals for the past 12 hrs:            Temp  Pulse  Resp  BP  SpO2     10/05/16 0746  98.1 ??F (36.7 ??C)  81  16  107/68  96 %     10/05/16 0305  97.8 ??F (36.6 ??C)  72  12  109/68  95 %     October 29, 2016 2228  97.4 ??F (36.3 ??C)  81  12  110/69  95 %           HPI:  Ms.  Melinda Scott  is a 45 y.o.  Caucasian female with PMH of asthma who is followed by Dr. 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                       .     Having failed conservative treatment, the patient elected to undergo the above named procedure as intervention.  Patient did obtain medical  clearance from their primary care provider prior to undergoing elective surgery.      Hospital Course:  Ms.  Melinda Scott was admitted to the hospital on 10/04/2016  6:18 AM .  The patient underwent  the above named procedure.  She remained hemodynamically stable during their hospitalization.  Received appropriate pre and post operative prophylactic antibiotics as well as maintained on SCDs.       Physical Exam:   Neurologic: Alert and oriented X 3   Sensory: normal   Eyes: conjunctivae/corneas clear. PERRL, EOM's intact.   Motor: Trapezius 5/5, Deltoids 5/5, Bicep 5/5, Triceps 5/5, Flexor Carpi Radialis 5/5, Flexor Pollicis Longus 5/5, First Dorsal Interosseous and Abductor Digit Minimi 5/5   Hoffman negative   Skin: Incision to left anterior neck with surgiglue intact, edges well approximated, no erythema, no edema, no drainage, no ecchymosis, and no hematoma.   Lungs: clear to auscultation bilaterally   Heart: regular rate and rhythm, S1, S2 normal, no murmur, click, rub or gallop   Abdomen: soft, non-tender. Bowel sounds normal. No masses,  no organomegaly   Extremities: extremities normal, atraumatic, no cyanosis or edema   Pulses: 2+ and symmetric        Pain was controlled with prn analgesics             Discharge Medications:        Current Discharge Medication List              START taking these medications          Details        cyclobenzaprine (FLEXERIL) 10 mg tablet  Take 1 Tab by mouth three (3) times daily as needed for Muscle Spasm(s) for up to 7 days. Indications: Muscle Spasm   Qty: 21 Tab, Refills:  0               oxyCODONE-acetaminophen (PERCOCET) 5-325 mg per tablet  Take 1-2 Tabs by mouth every four (4) hours as needed for up to 7 days. Max Daily Amount: 12 Tabs. Indications: Pain   Qty: 30 Tab, Refills:  0                     CONTINUE these medications which have NOT CHANGED          Details        VENTOLIN HFA 90 mcg/actuation inhaler  Take 2 Puffs by inhalation every four (4) hours as  needed.   Refills: 1                     STOP taking these medications                  traMADol (ULTRAM) 50 mg tablet  Comments:    Reason for Stopping:                                      Discharge Instructions:   Diet:    1. Begin with liquids and light foods such as jello or soups   2.  Advance as tolerated to your regular diet if not nauseated.   3.  Swallowing difficulties may be experienced up to 2 weeks post-operatively.  Modify food thickness as necessary.  You may also obtain over-the-counter chloraseptic spray.      Medications:   1. Strong oral narcotic pain medications have been prescribed for the first few days. Use only as directed. No pain medication is capable of taking away all the pain. Taking  your pills at  regular intervals will give you the best chance of having less pain.   2. If you need a refill PLEASE PLAN AHEAD. Call our office during regular hours (8-5).   3. Do not combine with alcoholic beverages.   4. Be careful as you walk, climb stairs or drive as mild dizziness is not unusual.   5. Do not take medications that have not been prescribed by your surgeon.   6. You may switch to over the counter pain medication of your choice such as Tylenol as you become more comfortable.   7.  Medications that thin the blood such as aspirin, Plavix, Warfarin, Coumadin, Pradaxa, and Xarelto must be held for the next 7 days.        Activity and Restrictions:   1. You should not drive until you are clear by your surgeon at your post-operative visit.   2.  In regards to your cervical collar, you MUST wear this at all times until your first follow-up appointment. (unless instructed otherwise)   3.  You should wear the collar even when sleeping.    4.  It can be removed for short periods of time as long as you are keeping your head centered over the shoulders without rotating or looking up or down.   5. You may take short walks inside and outside of your home and climb stairs.   6. You are to avoid work,  housework, yard work, snow shoveling, lifting of more than a few pounds, overhead work or strenuous activity.   7. Avoid any excessive stretching or range-of-motion of the neck.  Non-painful range-of-motion of the neck is allowed.   8.  Use "log roll" technique to get in and out of bed.     9.  Move overhead items frequently used down to eye or shoulder level.   10.  Squat or sit on a chair when removing items from the refrigerator.   11.  Do not bend to pick items up, use the squat lift method or use a reacher stick.     12.  Sit to put on pants, socks, and shoes.  Do not perform lower body dressing while standing up.  Use of a reacher stick is beneficial.     10.  Follow-up in the office in 4 weeks .      DRIVING:     1.  You should not drive until after your follow-up appointment.    2.  You can be in a vehicle for short distances, but if you travel any long distance, please stop about every 30 minutes and walk/stretch.    3.  You should NEVER drive while taking narcotic medication.      BATHING and INCISION CARE:   1. The incision may be tender to touch or feel numb: this is normal. The dressing is similar to steri-strips and will wear off on its own.  Edges may be trimmed.    2.  Keep the incision clean and dry.  You may shower. Pat the incision dry.   3.  When showering, do not bend head forward or backward, use a hand held shower hose if needed and/or sit down on a shower chair.   4.  Do not apply any lotions, ointments or oils on the incision.    5.  If you notice any excessive swelling, redness, or persistent drainage  around the incision, notify the office immediately.        RETURN  TO WORK  : (Per individual case)   1. The decision to return to work will be determined on an individual basis.    2.  Many people who have a strenuous job Conservation officer, historic buildings, heavy labor, etc) may need to be off work for up to 8 weeks.    3.  If you need a work note, please let us know as soon as possible, and not the same day you  are planning to return to work.      NUTRITION :   1.  Good nutrition is an essential part of healing.    2.  You should eat a balanced diet each day, including fruits, vegetables, dairy products and protein.    3.  Remember to drink plenty of water.    4.  If you have not had a bowel movement within 3 days of surgery, you will need to use a laxative or suppository that can be obtained over-the-counter at your local pharmacy.      CALL THE OFFICE:   ??  If you have severe pain unrelieved by the medications, new numbness or tingling in your arms;   ??  If you have a fever of 101.0??F or greater;    ??  If you notice excessive swelling, redness, or persistent drainage from the incision or IV site;    ??  If you have any questions or concerns.         Discharge Disposition:  Patient is medically stable for discharge from hospital with above discharge medications, instructions,  and follow up care. Discharge assessement and plan made with Dr. Su Ley.            CC:   PCP: Ovid Curd, MD

## 2016-10-05 NOTE — Progress Notes (Signed)
Progress Note      Patient: Melinda Scott               Sex: female          DOA: 10/04/2016         Date of Birth:  1971-11-24      Age:  45 y.o.        LOS:  LOS: 1 day                  Procedure(s):  cervical 5 - cervical 6  ANTERIOR CERVICAL DISCECTOMY WITH FUSION, spacer and anterior plating  1. C5-C6 anterior cervical diskectomy and fusion.  2. Insertion of PEEK interbody spacer.  3. Anterior plating Globus XTEND plate.  4. Use of Signafuse allograft.  ??  SURGERY DATE: 10/04/2016               Dx:  cervical disc herniation   m50.222  Cervical disc disease        Past Medical History:   Diagnosis Date   ??? Asthma    ??? Chronic obstructive pulmonary disease (HCC)        Past Surgical History:   Procedure Laterality Date   ??? HX CHOLECYSTECTOMY     ??? HX HYSTERECTOMY  2012       POD# 1  SUBJECTIVE:  Doing well.  Denies numbness, tingling, burning, and radiation.  Some surgical site pain but control with analgesics.  Tolerating diet and swallows well.  + flauts      OBJECTIVE:  Patient Vitals for the past 24 hrs:   Temp Pulse Resp BP SpO2   10/05/16 0746 98.1 ??F (36.7 ??C) 81 16 107/68 96 %   10/05/16 0305 97.8 ??F (36.6 ??C) 72 12 109/68 95 %   10/04/16 2228 97.4 ??F (36.3 ??C) 81 12 110/69 95 %   10/04/16 2055 97.5 ??F (36.4 ??C) 85 14 115/73 100 %   10/04/16 1543 97.6 ??F (36.4 ??C) 68 14 118/77 98 %   10/04/16 1522 - 63 14 - 98 %   10/04/16 1508 - 71 14 136/79 98 %   10/04/16 1457 - 72 14 132/73 98 %   10/04/16 1338 - 78 23 135/69 98 %   10/04/16 1307 - 70 14 124/73 99 %   10/04/16 1253 - 69 13 119/67 97 %   10/04/16 1238 - 80 18 127/54 98 %   10/04/16 1223 - 66 14 120/58 98 %   10/04/16 1208 - 69 14 126/73 98 %   10/04/16 1200 - 68 14 - 99 %   10/04/16 1153 - 69 14 130/71 99 %   10/04/16 1137 - 85 18 138/75 99 %   10/04/16 1133 - 73 15 124/50 99 %   10/04/16 1130 - 78 15 146/60 99 %   10/04/16 1113 - 64 14 134/58 99 %   10/04/16 1108 - 67 14 148/65 99 %   10/04/16 1103 - 68 11 149/68 100 %    10/04/16 1058 - 71 13 145/70 99 %   10/04/16 1056 97.4 ??F (36.3 ??C) 69 12 139/81 94 %   10/04/16 1053 - 77 13 139/81 95 %   10/04/16 1052 - 79 13 - 93 %   10/04/16 1051 - 83 23 - 91 %       Intake and Output    Intake/Output Summary (Last 24 hours) at 10/05/16 0900  Last data filed at 10/05/16 0307   Gross per  24 hour   Intake          2143.75 ml   Output             3500 ml   Net         -1356.25 ml     Data    No results found for this or any previous visit (from the past 24 hour(s)).     Current Facility-Administered Medications   Medication Dose Route Frequency   ??? sodium chloride (NS) flush 5-10 mL  5-10 mL IntraVENous Q8H   ??? sodium chloride (NS) flush 5-10 mL  5-10 mL IntraVENous PRN   ??? dextrose 5% - 0.45% NaCl with KCl 20 mEq/L infusion  75 mL/hr IntraVENous CONTINUOUS   ??? acetaminophen (TYLENOL) tablet 650 mg  650 mg Oral Q4H PRN   ??? oxyCODONE-acetaminophen (PERCOCET) 5-325 mg per tablet 1-2 Tab  1-2 Tab Oral Q4H PRN   ??? morphine injection 4-6 mg  4-6 mg IntraVENous Q3H PRN   ??? ondansetron (ZOFRAN) injection 4 mg  4 mg IntraVENous Q4H PRN   ??? docusate sodium (COLACE) capsule 100 mg  100 mg Oral BID   ??? cyclobenzaprine (FLEXERIL) tablet 10 mg  10 mg Oral TID PRN   ??? albuterol (PROVENTIL VENTOLIN) nebulizer solution 2.5 mg  2.5 mg Nebulization Q4H PRN       Physical Exam  General:  Neurologic: Alert and oriented X 3  Sensory: normal  Eyes: conjunctivae/corneas clear. PERRL, EOM's intact.  Motor: Trapezius 5/5, Deltoids 5/5, Bicep 5/5, Triceps 5/5, Flexor Carpi Radialis 5/5, Flexor Pollicis Longus 5/5, First Dorsal Interosseous and Abductor Digit Minimi 5/5  Hoffman negative  Skin: Incision to left anterior neck with surgiglue intact, edges well approximated, no erythema, no edema, no drainage, no ecchymosis, and no hematoma.  Lungs: clear to auscultation bilaterally  Heart: regular rate and rhythm, S1, S2 normal, no murmur, click, rub or gallop  Abdomen: soft, non-tender. Bowel sounds normal. No masses,  no  organomegaly  Extremities: extremities normal, atraumatic, no cyanosis or edema  Pulses: 2+ and symmetric          Assessment/Plan    45 y.o. year old female who is hospital day 1 for <principal problem not specified>.     1.)  Neurologic:  Doing well.  Pain controlled by prn analgesics.  Mobilizing.  Home today.        Assessment and plan made with Dr. Renard HamperKoen  Kiari Hosmer A Kemia Wendel, NP  10/05/2016  9:00 AM

## 2016-10-05 NOTE — Progress Notes (Signed)
Patient has designated her spouse to participate in his/her discharge plan and to receive any needed information.     Name:   Melinda Scott  spouse   [phone]   [address], [place], [place] [id number]   Oct 04, 2016     Address:  Phone number:

## 2016-10-05 NOTE — Progress Notes (Signed)
Problem: Discharge Planning  Goal: *Discharge to safe environment  Outcome: Progressing Towards Goal  Plan is to discharge to a safe environment

## 2016-10-05 NOTE — Other (Signed)
Bedside and Verbal shift change report given to Erna, RN (oncoming nurse) by Brittany Hayes, RN (offgoing nurse). Report included the following information SBAR, Kardex and MAR.

## 2016-10-05 NOTE — Progress Notes (Signed)
Problem: Falls - Risk of  Goal: *Absence of Falls  Document Schmid Fall Risk and appropriate interventions in the flowsheet.   Outcome: Progressing Towards Goal  Fall Risk Interventions:  Mobility Interventions: Patient to call before getting OOB     Mentation Interventions: Adequate sleep, hydration, pain control     Medication Interventions: Patient to call before getting OOB     Elimination Interventions: Call light in reach, Patient to call for help with toileting needs     History of Falls Interventions: Room close to nurse's station

## 2016-10-05 NOTE — Progress Notes (Signed)
Care Management Interventions  PCP Verified by CM: Yes  Mode of Transport at Discharge: Other (see comment)  Transition of Care Consult (CM Consult): Discharge Planning  MyChart Signup: No  Discharge Durable Medical Equipment: No  Physical Therapy Consult: Yes  Occupational Therapy Consult: No  Speech Therapy Consult: No  Current Support Network: Lives with Spouse  Confirm Follow Up Transport: Family  Plan discussed with Pt/Family/Caregiver: Yes  Discharge Location  Discharge Placement: Home

## 2016-10-05 NOTE — Progress Notes (Signed)
I have reviewed discharge instructions with the patient.  The patient verbalized understanding.

## 2016-10-05 NOTE — Discharge Summary (Signed)
Discharge Summary    Patient: Melinda Scott               Sex: female          DOA: 13-Oct-2016         Date of Birth:  09-18-1971      Age:  45 y.o.        LOS:  LOS: 1 day           Admit Date: 10-13-2016    Discharge Date: 10/05/2016    Consults: Hospitalist    Admission Diagnoses: cervical disc herniation   m50.222  Cervical disc disease    Discharge Diagnoses:    Problem List as of 10/05/2016  Date Reviewed: Oct 13, 2016          Codes Class Noted - Resolved    Cervical disc disease ICD-10-CM: M50.90  ICD-9-CM: 722.91  10/13/2016 - Present              No Known Allergies       Operative Procedure Performed: Procedure(s):  cervical 5 - cervical 6  ANTERIOR CERVICAL DISCECTOMY WITH FUSION, spacer and anterior plating   1. C5-C6 anterior cervical diskectomy and fusion.  2. Insertion of PEEK interbody spacer.  3. Anterior plating Globus XTEND plate.  4. Use of Signafuse allograft.    Data:   Patient Vitals for the past 12 hrs:   Temp Pulse Resp BP SpO2   10/05/16 0746 98.1 ??F (36.7 ??C) 81 16 107/68 96 %   10/05/16 0305 97.8 ??F (36.6 ??C) 72 12 109/68 95 %   13-Oct-2016 2228 97.4 ??F (36.3 ??C) 81 12 110/69 95 %       HPI:  [name]  is a 45 y.o.  Caucasian female with PMH of asthma who is followed by Dr. 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                      .    Having failed conservative treatment, the patient elected to undergo the above named procedure as intervention.  Patient did obtain medical clearance from their primary care provider prior to undergoing elective surgery.    Hospital Course: [name] was admitted to the hospital on 10-13-16  6:18 AM .  The patient underwent the above named procedure.  She remained hemodynamically stable during their hospitalization.  Received appropriate pre and post operative prophylactic antibiotics as well as maintained on SCDs.     Physical Exam:  Neurologic: Alert and oriented X 3   Sensory: normal  Eyes: conjunctivae/corneas clear. PERRL, EOM's intact.  Motor: Trapezius 5/5, Deltoids 5/5, Bicep 5/5, Triceps 5/5, Flexor Carpi Radialis 5/5, Flexor Pollicis Longus 5/5, First Dorsal Interosseous and Abductor Digit Minimi 5/5  Hoffman negative  Skin: Incision to left anterior neck with surgiglue intact, edges well approximated, no erythema, no edema, no drainage, no ecchymosis, and no hematoma.  Lungs: clear to auscultation bilaterally  Heart: regular rate and rhythm, S1, S2 normal, no murmur, click, rub or gallop  Abdomen: soft, non-tender. Bowel sounds normal. No masses,  no organomegaly  Extremities: extremities normal, atraumatic, no cyanosis or edema  Pulses: 2+ and symmetric      Pain was controlled with prn analgesics         Discharge Medications:     Current Discharge Medication List      START taking these medications    Details  cyclobenzaprine (FLEXERIL) 10 mg tablet Take 1 Tab by mouth three (3) times daily as needed for Muscle Spasm(s) for up to 7 days. Indications: Muscle Spasm  Qty: 21 Tab, Refills: 0      oxyCODONE-acetaminophen (PERCOCET) 5-325 mg per tablet Take 1-2 Tabs by mouth every four (4) hours as needed for up to 7 days. Max Daily Amount: 12 Tabs. Indications: Pain  Qty: 30 Tab, Refills: 0         CONTINUE these medications which have NOT CHANGED    Details   VENTOLIN HFA 90 mcg/actuation inhaler Take 2 Puffs by inhalation every four (4) hours as needed.  Refills: 1         STOP taking these medications       traMADol (ULTRAM) 50 mg tablet Comments:   Reason for Stopping:                     Discharge Instructions:  Diet:   1. Begin with liquids and light foods such as jello or soups  2.  Advance as tolerated to your regular diet if not nauseated.  3.  Swallowing difficulties may be experienced up to 2 weeks post-operatively.  Modify food thickness as necessary.  You may also obtain over-the-counter chloraseptic spray.    Medications:   1. Strong oral narcotic pain medications have been prescribed for the first few days. Use only as directed. No pain medication is capable of taking away all the pain. Taking your pills at regular intervals will give you the best chance of having less pain.  2. If you need a refill PLEASE PLAN AHEAD. Call our office during regular hours (8-5).  3. Do not combine with alcoholic beverages.  4. Be careful as you walk, climb stairs or drive as mild dizziness is not unusual.  5. Do not take medications that have not been prescribed by your surgeon.  6. You may switch to over the counter pain medication of your choice such as Tylenol as you become more comfortable.  7.  Medications that thin the blood such as aspirin, Plavix, Warfarin, Coumadin, Pradaxa, and Xarelto must be held for the next 7 days.      Activity and Restrictions:  1. You should not drive until you are clear by your surgeon at your post-operative visit.  2.  In regards to your cervical collar, you MUST wear this at all times until your first follow-up appointment. (unless instructed otherwise)  3.  You should wear the collar even when sleeping.   4.  It can be removed for short periods of time as long as you are keeping your head centered over the shoulders without rotating or looking up or down.  5. You may take short walks inside and outside of your home and climb stairs.  6. You are to avoid work, housework, yard work, snow shoveling, lifting of more than a few pounds, overhead work or strenuous activity.  7. Avoid any excessive stretching or range-of-motion of the neck.  Non-painful range-of-motion of the neck is allowed.  8.  Use "log roll" technique to get in and out of bed.    9.  Move overhead items frequently used down to eye or shoulder level.  10.  Squat or sit on a chair when removing items from the refrigerator.  11.  Do not bend to pick items up, use the squat lift method or use a reacher stick.     12.  Sit to put on  pants, socks, and shoes.  Do not perform lower body dressing while standing up.  Use of a reacher stick is beneficial.    10.  Follow-up in the office in 4 weeks.    DRIVING:    1.  You should not drive until after your follow-up appointment.   2.  You can be in a vehicle for short distances, but if you travel any long distance, please stop about every 30 minutes and walk/stretch.   3.  You should NEVER drive while taking narcotic medication.    BATHING and INCISION CARE:  1. The incision may be tender to touch or feel numb: this is normal. The dressing is similar to steri-strips and will wear off on its own.  Edges may be trimmed.   2.  Keep the incision clean and dry.  You may shower. Pat the incision dry.  3.  When showering, do not bend head forward or backward, use a hand held shower hose if needed and/or sit down on a shower chair.  4.  Do not apply any lotions, ointments or oils on the incision.   5.  If you notice any excessive swelling, redness, or persistent drainage around the incision, notify the office immediately.      RETURN TO WORK : (Per individual case)  1. The decision to return to work will be determined on an individual basis.   2.  Many people who have a strenuous job Conservation officer, historic buildings, heavy labor, etc) may need to be off work for up to 8 weeks.   3.  If you need a work note, please let us know as soon as possible, and not the same day you are planning to return to work.    NUTRITION :  1.  Good nutrition is an essential part of healing.   2.  You should eat a balanced diet each day, including fruits, vegetables, dairy products and protein.   3.  Remember to drink plenty of water.   4.  If you have not had a bowel movement within 3 days of surgery, you will need to use a laxative or suppository that can be obtained over-the-counter at your local pharmacy.    CALL THE OFFICE:  ? If you have severe pain unrelieved by the medications, new numbness or tingling in your arms;   ? If you have a fever of 101.0??F or greater;   ? If you notice excessive swelling, redness, or persistent drainage from the incision or IV site;   ? If you have any questions or concerns.      Discharge Disposition:  Patient is medically stable for discharge from hospital with above discharge medications, instructions, and follow up care. Discharge assessement and plan made with Dr. Su Ley.        CC:  PCP: Ovid Curd, MD

## 2016-10-05 NOTE — Progress Notes (Signed)
0800  Received pt on bed, wearing Aspen collar, speech clear, lungs clear, no wheezing, incision looks clean  No redness and no swelling, sore when swallowing but no choking effect,    1030  Medicated for pain prior to discharge, discharge  Instruction given by  Angie, discharge home.

## 2016-10-05 NOTE — Progress Notes (Addendum)
[facility]   Discharge Planning/Social Services Assessment    Reasons for Intervention: Interviewed patient, she agrees to share her discharge information with her spouse see below.,  She was independent prior to admission and sees Dr Landry DykeBakhshi for her primary care needs.  Her discharge plan is to recover at her mothers home.  Her mothers name and address with phone number  [place] [facility] [place] [facility] [place] [address] [id number], [phone].     High Risk Criteria   Yes  No   Physician Referral   Yes  No        Date    Nursing Referral   Yes  No        Date    Patient/Family Request   Yes  No        Date       Resources:    Medicare   Yes  No   Medicaid   Yes  No   No Resources   Yes  No   Private Insurance   Yes  No [employer]   Case Manager Name/Phone Number    Other   Yes  No        (i.e. Workman's Comp)         Prior Services:    Prior Services   Yes  No   Home Health   Yes  No   Agency    Private Home Care   Yes  No        Number of Hours    Home Care Program   Yes  No   Case Manager    Meals on Wheels   Yes  No   Office on Aging   Yes  No   Transportation Services   Yes  No   Nursing Home   Yes  No        Nursing Home Name    Rehab/VA Hospital   Yes  No        Rehab/VA Name    Other       Information Source:      Information obtained from   Patient   Parent    Guardian   Child   Spouse    Significant Other/Partner    Friend       EMS     Nursing Home Chart           Other: chart   Chart Review   Yes  No     Family/Support System:    Patient lives with   Alone     Spouse    Significant Other   Children   Caretaker    Parent   Sibling      Other       Other Support System:    Is the patient responsible for care of others   Yes  No   Information of person caring for patient on  discharge self   Managers financial affairs independently   Yes  No   If no, explain:      Status Prior to Admission:    Mental Status   Awake   Alert   Oriented   Quiet/Calm  Lethargic/Sedated     Disoriented   Restless/Anxious   Combative   Personal Care   Dependent   Independent Personal Care   Requires Assistance   Meal Preparation Ability   Independent    Standby Assistance    Minimal Assistance    Moderate  Assistance   Maximum Assistance      Total Assistance   Chores   Independent with [place] Resident    Requires Assistance   Bowel/Bladder   Continent   Catheter   Incontinent   Ostomy Self-Care     Urine Diversion Self-Care   Maximum Assistance      Total Assistance   Number of Persons needed for assistance    DME at home   [place], [place], Straight    Commode     Bathroom/Grab [facility] Bed   Nebulizer   Oxygen            Raised Toilet Seat   Shower Chair   Side Rails for Bed    Tub Transfer Bench    Walker, Building surveyor, Standard       Other:   Vendor      Treatment Presently Receiving:    Current Treatments   Chemotherapy   Dialysis   Insulin   IVAB  IVF    O2   PCA    PT    RT    Tube Feedings    Wound Care     Psychosocial Evaluation:    Verbalized Knowledge of Disease Process   Patient  Family   Coping with Disease Process   Patient  Family   Requires Further Counseling Coping with Disease Process   Patient  Family     Identified Projected Needs:    Home Health Aid   Yes  No   Transportation   Yes  No   Education   Yes  No        Specific Education     Financial Counseling   Yes  No   Inability to Care for Self/Will Require 24 hour care   Yes  No   Pain Management   Yes  No   Home Infusion Therapy   Yes  No   Oxygen Therapy   Yes  No   DME   Yes  No   Long Term Care Placement   Yes  No   Rehab   Yes  No   Physical Therapy   Yes  No   Needs Anticipated At This Time   Yes  No     Intra-Hospital Referral:    [place]   Yes  No   Life Coach   Yes  No   Patient Representative   Yes  No   Staff for Teaching Needs   Yes  No   Specialty Teaching Needs     Diabetic Educator   Yes  No   Referral for Diabetic Educator Needed   Yes  No   If Yes, place order for Nutritionist or Diabetic Consult     Tentative Discharge Plan:    Home with No Services   Yes  No   Home with Home Health Follow-up   Yes  No        If Yes, specify type    [place]   Yes  No        If Yes, specify type    Meals on Wheels   Yes  No   Office of Aging   Yes  No   NHP   Yes  No   Return to the Nursing Home   Yes  No   Rehab Therapy   Yes  No  Acute Rehab   Yes  No   Subacute Rehab   Yes  No   Private Care   Yes  No   Substance Abuse Referral   Yes  No   Transportation   Yes  No   Chore Service   Yes  No   Inpatient Hospice   Yes  No   OP RT   Yes   No   OP Hemo   Yes   No   OP PT   Yes  No   Support Group   Yes  No   Reach to Recovery   Yes  No   OP Oncology Clinic   Yes  No   Clinic Appointment   Yes  No   DME   Yes  No   Comments    Name of D/C Planner or Social Worker Given to Patient or Family Lurena Nida, RN   Phone Number [phone]        Extension [phone]   Date Oct 04, 2016   Time 706 am   If you are discharged home, whom do you designate to participate in your discharge plan and receive any information needed?     Enter name of designee Tsuyako Jolley  spouse        Phone # of designee [phone]        Address of designee [address] [place], [place] [address]        Updated Oct 04, 2016        Patient refused to designate any           individual

## 2016-10-29 ENCOUNTER — Other Ambulatory Visit: Payer: Self-pay | Admitting: Family Medicine

## 2016-10-29 DIAGNOSIS — Z1231 Encounter for screening mammogram for malignant neoplasm of breast: Secondary | ICD-10-CM

## 2016-11-21 ENCOUNTER — Ambulatory Visit
Admission: RE | Admit: 2016-11-21 | Discharge: 2016-11-21 | Disposition: A | Discharge status: Home / Self Care | Payer: BLUE CROSS/BLUE SHIELD | Source: Ambulatory Visit | Attending: Family Medicine | Admitting: Family Medicine

## 2016-11-21 DIAGNOSIS — Z1231 Encounter for screening mammogram for malignant neoplasm of breast: Secondary | ICD-10-CM

## 2017-09-25 ENCOUNTER — Other Ambulatory Visit: Payer: Self-pay | Admitting: Family Medicine

## 2017-09-25 DIAGNOSIS — Z1231 Encounter for screening mammogram for malignant neoplasm of breast: Secondary | ICD-10-CM

## 2017-11-27 ENCOUNTER — Ambulatory Visit
Admission: RE | Admit: 2017-11-27 | Discharge: 2017-11-27 | Disposition: A | Discharge status: Home / Self Care | Payer: BLUE CROSS/BLUE SHIELD | Source: Ambulatory Visit | Attending: Family Medicine | Admitting: Family Medicine

## 2017-11-27 DIAGNOSIS — Z1231 Encounter for screening mammogram for malignant neoplasm of breast: Secondary | ICD-10-CM

## 2018-11-03 ENCOUNTER — Other Ambulatory Visit: Payer: Self-pay | Admitting: Emergency Medicine

## 2018-11-03 DIAGNOSIS — Z1231 Encounter for screening mammogram for malignant neoplasm of breast: Secondary | ICD-10-CM

## 2018-12-31 ENCOUNTER — Ambulatory Visit: Payer: BLUE CROSS/BLUE SHIELD

## 2019-02-11 ENCOUNTER — Ambulatory Visit
Admission: RE | Admit: 2019-02-11 | Discharge: 2019-02-11 | Disposition: A | Discharge status: Home / Self Care | Payer: No Typology Code available for payment source | Source: Ambulatory Visit | Attending: Emergency Medicine | Admitting: Emergency Medicine

## 2019-02-11 DIAGNOSIS — Z1231 Encounter for screening mammogram for malignant neoplasm of breast: Secondary | ICD-10-CM

## 2019-06-29 ENCOUNTER — Encounter: Payer: PRIVATE HEALTH INSURANCE | Primary: Family Medicine

## 2019-10-21 ENCOUNTER — Other Ambulatory Visit: Payer: Self-pay | Admitting: Family Medicine

## 2019-10-21 DIAGNOSIS — Z1231 Encounter for screening mammogram for malignant neoplasm of breast: Secondary | ICD-10-CM

## 2020-02-16 DIAGNOSIS — Z124 Encounter for screening for malignant neoplasm of cervix: Secondary | ICD-10-CM

## 2020-02-17 ENCOUNTER — Inpatient Hospital Stay: Admit: 2020-02-17 | Payer: PRIVATE HEALTH INSURANCE | Primary: Family Medicine

## 2020-02-17 ENCOUNTER — Other Ambulatory Visit: Payer: Self-pay

## 2020-02-17 ENCOUNTER — Ambulatory Visit
Admission: RE | Admit: 2020-02-17 | Discharge: 2020-02-17 | Disposition: A | Discharge status: Home / Self Care | Payer: No Typology Code available for payment source | Source: Ambulatory Visit | Attending: Family Medicine | Admitting: Family Medicine

## 2020-02-17 DIAGNOSIS — Z1231 Encounter for screening mammogram for malignant neoplasm of breast: Secondary | ICD-10-CM

## 2020-02-19 ENCOUNTER — Other Ambulatory Visit: Payer: Self-pay | Admitting: Family Medicine

## 2020-02-19 DIAGNOSIS — R928 Other abnormal and inconclusive findings on diagnostic imaging of breast: Secondary | ICD-10-CM

## 2020-02-22 LAB — PAP + HPV E6/E7, RFLX HPV 16, 18/45
HPV MRNA E6/E7: NOT DETECTED
HPV mRNA E6/E7: NOT DETECTED

## 2020-03-02 ENCOUNTER — Other Ambulatory Visit: Payer: Self-pay | Admitting: Family Medicine

## 2020-03-02 ENCOUNTER — Ambulatory Visit
Admission: RE | Admit: 2020-03-02 | Discharge: 2020-03-02 | Disposition: A | Discharge status: Home / Self Care | Payer: Self-pay | Source: Ambulatory Visit | Attending: Family Medicine | Admitting: Family Medicine

## 2020-03-02 ENCOUNTER — Other Ambulatory Visit: Payer: Self-pay

## 2020-03-02 DIAGNOSIS — R928 Other abnormal and inconclusive findings on diagnostic imaging of breast: Secondary | ICD-10-CM

## 2020-03-02 DIAGNOSIS — R921 Mammographic calcification found on diagnostic imaging of breast: Secondary | ICD-10-CM

## 2020-03-16 ENCOUNTER — Ambulatory Visit
Admission: RE | Admit: 2020-03-16 | Discharge: 2020-03-16 | Disposition: A | Discharge status: Home / Self Care | Payer: Self-pay | Source: Ambulatory Visit | Attending: Family Medicine | Admitting: Family Medicine

## 2020-03-16 ENCOUNTER — Ambulatory Visit
Admission: RE | Admit: 2020-03-16 | Discharge: 2020-03-16 | Disposition: A | Discharge status: Home / Self Care | Payer: No Typology Code available for payment source | Source: Ambulatory Visit | Attending: Family Medicine | Admitting: Family Medicine

## 2020-03-16 ENCOUNTER — Other Ambulatory Visit: Payer: Self-pay

## 2020-03-16 DIAGNOSIS — R921 Mammographic calcification found on diagnostic imaging of breast: Secondary | ICD-10-CM

## 2020-03-16 HISTORY — PX: BREAST BIOPSY: SHX20

## 2020-05-04 ENCOUNTER — Ambulatory Visit: Payer: Self-pay | Admitting: Surgery

## 2020-05-04 DIAGNOSIS — N6311 Unspecified lump in the right breast, upper outer quadrant: Secondary | ICD-10-CM

## 2020-05-04 NOTE — H&P (Signed)
History of Present Illness Denise Solomon. Denise Dacosta MD; 05/04/2020 2:27 PM) The patient is a 49 year old female who presents with a breast mass. PCP - Dr. [name]  This is a 49 year old female who presents after recent screening mammogram revealed some calcifications in the right upper outer quadrant. She underwent further workup including biopsy that showed flat epithelial atypia. The patient has no family history of breast cancer. She has never had problems with her breasts before. No previous biopsies. She is referred to [place] to discuss surgical excision.  CLINICAL DATA: Patient recalled from screening for right breast calcifications.  EXAM: DIGITAL DIAGNOSTIC UNILATERAL RIGHT MAMMOGRAM WITH CAD AND TOMO  COMPARISON: Previous exam(s).  ACR Breast Density Category c: The breast tissue is heterogeneously dense, which may obscure small masses.  FINDINGS: Magnification true lateral view of the right breast was obtained. Additionally, CC view and full paddle true lateral tomosynthesis views of the right breast were obtained. According to the tomosynthesis images, the calcifications are located laterally however are not able to be identified on the CC images. Overall there is a 6 mm group of punctate calcifications within the upper-outer right breast.  Mammographic images were processed with CAD.  IMPRESSION: Indeterminate calcifications upper-outer right breast demonstrated on the MLO view only. These are located laterally according to the tomosynthesis images.  RECOMMENDATION: Stereotactic guided core needle biopsy from a lateral approach of the indeterminate calcifications within the upper-outer right breast demonstrated on the ML view only.  I have discussed the findings and recommendations with the patient. If applicable, a reminder letter will be sent to the patient regarding the next appointment.  BI-RADS CATEGORY 4: Suspicious.   Electronically Signed By: [name] M.D.  CLINICAL DATA: 49 year old female presenting for biopsy of calcifications in the right breast.  EXAM: RIGHT BREAST STEREOTACTIC CORE NEEDLE BIOPSY  COMPARISON: Previous exams.  FINDINGS: The patient and I discussed the procedure of stereotactic-guided biopsy including benefits and alternatives. We discussed the high likelihood of a successful procedure. We discussed the risks of the procedure including infection, bleeding, tissue injury, clip migration, and inadequate sampling. Informed written consent was given. The usual time out protocol was performed immediately prior to the procedure.  Using sterile technique and 1% Lidocaine as local anesthetic, under stereotactic guidance, a 9 gauge vacuum assisted device was used to perform core needle biopsy of calcifications in the upper outer far posterior breast using a lateral approach. Specimen radiograph was performed showing at least 2 specimens with calcifications. Specimens with calcifications are identified for pathology.  Lesion quadrant: Upper outer quadrant  At the conclusion of the procedure, coil tissue marker clip was deployed into the biopsy cavity. Follow-up 2-view mammogram was performed and dictated separately.  IMPRESSION: Stereotactic-guided biopsy of calcifications in the right breast. No apparent complications.  Electronically Signed: By: [name] M.D.  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 This was found to be concordant by Dr. [name].    Past Surgical History (Denise Solomon, CMA; 05/04/2020 1:44 PM) Breast Biopsy Right. Tonsillectomy  Diagnostic Studies History (Denise Solomon, CMA; 05/04/2020 1:44 PM) Colonoscopy never Mammogram within last year Pap Smear 1-5 years ago  Allergies (Denise Solomon, CMA; 05/04/2020 1:45 PM) Bactrim  *ANTI-INFECTIVE AGENTS - MISC.* Erythromycin (Acne Aid) *DERMATOLOGICALS* Penicillins Allergies Reconciled  Medication History (Denise Solomon, CMA; 05/04/2020 1:45 PM) No Current Medications Medications Reconciled  Social History [name], CMA; 05/04/2020 1:44 PM)  Caffeine use Carbonated beverages, Coffee, Tea. No alcohol use No drug use Tobacco use Current every day smoker.  Family History Denise Solomon, [place]; 05/04/2020 1:44 PM) Colon Polyps Mother. Depression Mother. Heart Disease Family Members In General. Heart disease in female family member before age 43 Ischemic Bowel Disease Mother. Malignant Neoplasm Of Pancreas Family Members In General.  Pregnancy / Birth History Denise Solomon, [place]; 05/04/2020 1:44 PM) Age at menarche 77 years. Gravida 1 Irregular periods Maternal age 32-20 Para 0  Other Problems (Denise Solomon, CMA; 05/04/2020 1:44 PM) Anxiety Disorder Back Pain Bladder Problems Gastric Ulcer Gastroesophageal Reflux Disease General anesthesia - complications     Review of Systems (Denise Solomon CMA; 05/04/2020 1:44 PM) General Not Present- Appetite Loss, Chills, Fatigue, Fever, Night Sweats, Weight Gain and Weight Loss. Skin Not Present- Change in Wart/Mole, Dryness, Hives, Jaundice, New Lesions, Non-Healing Wounds, Rash and Ulcer. HEENT Not Present- Earache, Hearing Loss, Hoarseness, Nose Bleed, Oral Ulcers, Ringing in the Ears, Seasonal Allergies, Sinus Pain, Sore Throat, Visual Disturbances, Wears glasses/contact lenses and Yellow Eyes. Respiratory Not Present- Bloody sputum, Chronic Cough, Difficulty Breathing, Snoring and Wheezing. Breast Not Present- Breast Mass, Breast Pain, Nipple Discharge and Skin Changes. Cardiovascular Not Present- Chest Pain, Difficulty Breathing Lying Down, Leg Cramps, Palpitations, Rapid Heart Rate, Shortness of Breath and Swelling of Extremities. Gastrointestinal Not Present- Abdominal Pain, Bloating,  Bloody Stool, Change in Bowel Habits, Chronic diarrhea, Constipation, Difficulty Swallowing, Excessive gas, Gets full quickly at meals, Hemorrhoids, Indigestion, Nausea, Rectal Pain and Vomiting. Female Genitourinary Present- Frequency. Not Present- Nocturia, Painful Urination, Pelvic Pain and Urgency. Musculoskeletal Present- Back Pain. Not Present- Joint Pain, Joint Stiffness, Muscle Pain, Muscle Weakness and Swelling of Extremities. Neurological Present- Numbness. Not Present- Decreased Memory, Fainting, Headaches, Seizures, Tingling, Tremor, Trouble walking and Weakness. Psychiatric Not Present- Anxiety, Bipolar, Change in Sleep Pattern, Depression, Fearful and Frequent crying. Endocrine Not Present- Cold Intolerance, Excessive Hunger, Hair Changes, Heat Intolerance, Hot flashes and New Diabetes. Hematology Not Present- Blood Thinners, Easy Bruising, Excessive bleeding, Gland problems, HIV and Persistent Infections.  Vitals (Denise Solomon CMA; 05/04/2020 1:45 PM) 05/04/2020 1:45 PM Weight: 118.13 lb Height: 62in Body Surface Area: 1.53 m Body Mass Index: 21.61 kg/m  Temp.: 98.55F  Pulse: 113 (Regular)  BP: 122/82(Sitting, Left Arm, Standard)        Physical Exam Rodman Key K. Elie Leppo MD; 05/04/2020 2:29 PM)  The physical exam findings are as follows: Note:Constitutional: WDWN in NAD, conversant, no obvious deformities; Eyes: Pupils equal, round; sclera anicteric; moist conjunctiva; no lid lag HENT: Oral mucosa moist; good dentition Neck: No masses palpated, trachea midline; no thyromegaly Breasts: Symmetric, bilateral fibrocystic changes, no dominant masses. No nipple changes. No nipple discharge. No axillary lymphadenopathy. Lungs: CTA bilaterally; normal respiratory effort CV: Regular rate and rhythm; no murmurs; extremities well-perfused with no edema Abd: +bowel sounds, soft, non-tender, no palpable organomegaly; no palpable hernias Musc: Normal gait; no apparent  clubbing or cyanosis in extremities Lymphatic: No palpable cervical or axillary lymphadenopathy Skin: Warm, dry; no sign of jaundice Psychiatric - alert and oriented x 4; calm mood and affect    Assessment & Plan Rodman Key K. Saraya Tirey MD; 05/04/2020 2:30 PM)  MASS OF RIGHT BREAST ON MAMMOGRAM (N63.10) Impression: Flat epithelial atypia - RUOQ  Current Plans Schedule for Surgery - Right radioactive seed localized lumpectomy. The surgical procedure has been discussed with the patient. Potential risks, benefits, alternative treatments, and expected outcomes have been explained. All of the patient's questions at this time have been answered. The  likelihood of reaching the patient's treatment goal is good. The patient understand the proposed surgical procedure and wishes to proceed. Note:We discussed the fact that flat epithelial atypia only increases her overall risk of developing breast cancer by a couple percentage points. However conservative some fragments would possibly require future biopsies to confirm that the area is not progressing. The patient prefers to have surgery at this time to remove this area.  Denise Solomon. Corliss Skains, MD, [facility]  General/ Trauma Surgery   05/04/2020 2:30 PM

## 2020-05-12 ENCOUNTER — Other Ambulatory Visit: Payer: Self-pay | Admitting: Surgery

## 2020-05-12 DIAGNOSIS — N6311 Unspecified lump in the right breast, upper outer quadrant: Secondary | ICD-10-CM

## 2020-06-29 ENCOUNTER — Other Ambulatory Visit: Payer: Self-pay

## 2020-06-29 ENCOUNTER — Encounter (HOSPITAL_BASED_OUTPATIENT_CLINIC_OR_DEPARTMENT_OTHER): Payer: Self-pay | Admitting: Surgery

## 2020-07-01 ENCOUNTER — Other Ambulatory Visit (HOSPITAL_COMMUNITY)
Admission: RE | Admit: 2020-07-01 | Discharge: 2020-07-01 | Disposition: A | Discharge status: Home / Self Care | Payer: BC Managed Care – PPO | Source: Ambulatory Visit | Attending: Surgery | Admitting: Surgery

## 2020-07-01 DIAGNOSIS — Z20822 Contact with and (suspected) exposure to covid-19: Secondary | ICD-10-CM | POA: Diagnosis not present

## 2020-07-01 DIAGNOSIS — Z01812 Encounter for preprocedural laboratory examination: Secondary | ICD-10-CM | POA: Insufficient documentation

## 2020-07-01 LAB — SARS CORONAVIRUS 2 (TAT 6-24 HRS): SARS Coronavirus 2: NEGATIVE

## 2020-07-01 NOTE — Progress Notes (Signed)

## 2020-07-04 ENCOUNTER — Ambulatory Visit
Admission: RE | Admit: 2020-07-04 | Discharge: 2020-07-04 | Disposition: A | Discharge status: Home / Self Care | Payer: BC Managed Care – PPO | Source: Ambulatory Visit | Attending: Surgery | Admitting: Surgery

## 2020-07-04 ENCOUNTER — Other Ambulatory Visit: Payer: Self-pay

## 2020-07-04 DIAGNOSIS — N6311 Unspecified lump in the right breast, upper outer quadrant: Secondary | ICD-10-CM

## 2020-07-05 ENCOUNTER — Ambulatory Visit (HOSPITAL_BASED_OUTPATIENT_CLINIC_OR_DEPARTMENT_OTHER): Payer: BC Managed Care – PPO | Admitting: Anesthesiology

## 2020-07-05 ENCOUNTER — Ambulatory Visit
Admission: RE | Admit: 2020-07-05 | Discharge: 2020-07-05 | Disposition: A | Discharge status: Home / Self Care | Payer: No Typology Code available for payment source | Source: Ambulatory Visit | Attending: Surgery | Admitting: Surgery

## 2020-07-05 ENCOUNTER — Ambulatory Visit (HOSPITAL_BASED_OUTPATIENT_CLINIC_OR_DEPARTMENT_OTHER)
Admission: RE | Admit: 2020-07-05 | Discharge: 2020-07-05 | Disposition: A | Discharge status: Home / Self Care | Payer: BC Managed Care – PPO | Attending: Surgery | Admitting: Surgery

## 2020-07-05 ENCOUNTER — Encounter (HOSPITAL_BASED_OUTPATIENT_CLINIC_OR_DEPARTMENT_OTHER)
Admission: RE | Disposition: A | Discharge status: Home / Self Care | Payer: Self-pay | Source: Home / Self Care | Attending: Surgery

## 2020-07-05 ENCOUNTER — Other Ambulatory Visit: Payer: Self-pay

## 2020-07-05 ENCOUNTER — Encounter (HOSPITAL_BASED_OUTPATIENT_CLINIC_OR_DEPARTMENT_OTHER): Payer: Self-pay | Admitting: Surgery

## 2020-07-05 DIAGNOSIS — N6311 Unspecified lump in the right breast, upper outer quadrant: Secondary | ICD-10-CM

## 2020-07-05 DIAGNOSIS — F17200 Nicotine dependence, unspecified, uncomplicated: Secondary | ICD-10-CM | POA: Diagnosis not present

## 2020-07-05 DIAGNOSIS — N6081 Other benign mammary dysplasias of right breast: Secondary | ICD-10-CM | POA: Diagnosis not present

## 2020-07-05 DIAGNOSIS — N6489 Other specified disorders of breast: Secondary | ICD-10-CM | POA: Diagnosis present

## 2020-07-05 HISTORY — PX: BREAST EXCISIONAL BIOPSY: SUR124

## 2020-07-05 HISTORY — PX: BREAST LUMPECTOMY WITH RADIOACTIVE SEED LOCALIZATION: SHX6424

## 2020-07-05 LAB — POCT PREGNANCY, URINE: Preg Test, Ur: NEGATIVE

## 2020-07-05 SURGERY — BREAST LUMPECTOMY WITH RADIOACTIVE SEED LOCALIZATION
Anesthesia: General | Site: Breast | Laterality: Right

## 2020-07-05 MED ORDER — ACETAMINOPHEN 500 MG PO TABS
1000.0000 mg | ORAL_TABLET | ORAL | Status: AC
  Administered 2020-07-05: 1000 mg via ORAL

## 2020-07-05 MED ORDER — GABAPENTIN 300 MG PO CAPS
ORAL_CAPSULE | ORAL | Status: AC
  Filled 2020-07-05: qty 1

## 2020-07-05 MED ORDER — OXYCODONE HCL 5 MG/5ML PO SOLN: 5.0000 mg | Freq: Once | ORAL | Status: DC | PRN

## 2020-07-05 MED ORDER — LACTATED RINGERS IV SOLN: INTRAVENOUS | Status: DC

## 2020-07-05 MED ORDER — OXYCODONE HCL 5 MG PO TABS: 5.0000 mg | ORAL_TABLET | Freq: Once | ORAL | Status: DC | PRN

## 2020-07-05 MED ORDER — FENTANYL CITRATE (PF) 100 MCG/2ML IJ SOLN
INTRAMUSCULAR | Status: AC
  Filled 2020-07-05: qty 2

## 2020-07-05 MED ORDER — BUPIVACAINE HCL (PF) 0.25 % IJ SOLN
INTRAMUSCULAR | Status: DC | PRN
  Administered 2020-07-05: 10 mL

## 2020-07-05 MED ORDER — PROPOFOL 10 MG/ML IV BOLUS
INTRAVENOUS | Status: DC | PRN
  Administered 2020-07-05: 100 mg via INTRAVENOUS

## 2020-07-05 MED ORDER — ONDANSETRON HCL 4 MG/2ML IJ SOLN
INTRAMUSCULAR | Status: DC | PRN
  Administered 2020-07-05: 4 mg via INTRAVENOUS

## 2020-07-05 MED ORDER — CHLORHEXIDINE GLUCONATE CLOTH 2 % EX PADS: 6.0000 | MEDICATED_PAD | Freq: Once | CUTANEOUS | Status: DC

## 2020-07-05 MED ORDER — FENTANYL CITRATE (PF) 100 MCG/2ML IJ SOLN: 25.0000 ug | INTRAMUSCULAR | Status: DC | PRN

## 2020-07-05 MED ORDER — CEFAZOLIN SODIUM-DEXTROSE 2-4 GM/100ML-% IV SOLN: 2.0000 g | INTRAVENOUS | Status: DC

## 2020-07-05 MED ORDER — ACETAMINOPHEN 500 MG PO TABS: 1000.0000 mg | ORAL_TABLET | Freq: Once | ORAL | Status: AC

## 2020-07-05 MED ORDER — FENTANYL CITRATE (PF) 100 MCG/2ML IJ SOLN
INTRAMUSCULAR | Status: DC | PRN
  Administered 2020-07-05: 100 ug via INTRAVENOUS

## 2020-07-05 MED ORDER — PROMETHAZINE HCL 25 MG/ML IJ SOLN: 6.2500 mg | INTRAMUSCULAR | Status: DC | PRN

## 2020-07-05 MED ORDER — ACETAMINOPHEN 500 MG PO TABS
ORAL_TABLET | ORAL | Status: AC
  Filled 2020-07-05: qty 2

## 2020-07-05 MED ORDER — GABAPENTIN 300 MG PO CAPS
300.0000 mg | ORAL_CAPSULE | ORAL | Status: AC
  Administered 2020-07-05: 300 mg via ORAL

## 2020-07-05 MED ORDER — MIDAZOLAM HCL 5 MG/5ML IJ SOLN
INTRAMUSCULAR | Status: DC | PRN
  Administered 2020-07-05: 2 mg via INTRAVENOUS

## 2020-07-05 MED ORDER — PROPOFOL 10 MG/ML IV BOLUS
INTRAVENOUS | Status: AC
  Filled 2020-07-05: qty 20

## 2020-07-05 MED ORDER — HYDROCODONE-ACETAMINOPHEN 5-325 MG PO TABS: 1.0000 | ORAL_TABLET | Freq: Four times a day (QID) | ORAL | 0 refills | Status: DC | PRN

## 2020-07-05 MED ORDER — MIDAZOLAM HCL 2 MG/2ML IJ SOLN
INTRAMUSCULAR | Status: AC
  Filled 2020-07-05: qty 2

## 2020-07-05 MED ORDER — LIDOCAINE 2% (20 MG/ML) 5 ML SYRINGE
INTRAMUSCULAR | Status: DC | PRN
  Administered 2020-07-05: 60 mg via INTRAVENOUS

## 2020-07-05 MED ORDER — CEFAZOLIN SODIUM-DEXTROSE 2-4 GM/100ML-% IV SOLN
INTRAVENOUS | Status: AC
  Filled 2020-07-05: qty 100

## 2020-07-05 MED ORDER — DEXAMETHASONE SODIUM PHOSPHATE 10 MG/ML IJ SOLN
INTRAMUSCULAR | Status: DC | PRN
  Administered 2020-07-05: 5 mg via INTRAVENOUS

## 2020-07-05 SURGICAL SUPPLY — 47 items
APPLIER CLIP 9.375 MED OPEN (MISCELLANEOUS) ×3
BENZOIN TINCTURE PRP APPL 2/3 (GAUZE/BANDAGES/DRESSINGS) ×3 IMPLANT
BLADE HEX COATED 2.75 (ELECTRODE) ×3 IMPLANT
BLADE SURG 15 STRL LF DISP TIS (BLADE) ×1 IMPLANT
BLADE SURG 15 STRL SS (BLADE) ×2
CANISTER SUC SOCK COL 7IN (MISCELLANEOUS) IMPLANT
CANISTER SUCT 1200ML W/VALVE (MISCELLANEOUS) IMPLANT
CHLORAPREP W/TINT 26 (MISCELLANEOUS) ×3 IMPLANT
CLIP APPLIE 9.375 MED OPEN (MISCELLANEOUS) ×1 IMPLANT
CLOSURE WOUND 1/2 X4 (GAUZE/BANDAGES/DRESSINGS) ×1
COVER BACK TABLE 60X90IN (DRAPES) ×3 IMPLANT
COVER MAYO STAND STRL (DRAPES) ×3 IMPLANT
COVER PROBE W GEL 5X96 (DRAPES) ×3 IMPLANT
DRAPE LAPAROTOMY 100X72 PEDS (DRAPES) ×3 IMPLANT
DRAPE UTILITY XL STRL (DRAPES) ×3 IMPLANT
DRSG TEGADERM 4X4.75 (GAUZE/BANDAGES/DRESSINGS) ×3 IMPLANT
ELECT REM PT RETURN 9FT ADLT (ELECTROSURGICAL) ×3
ELECTRODE REM PT RTRN 9FT ADLT (ELECTROSURGICAL) ×1 IMPLANT
GAUZE SPONGE 4X4 12PLY STRL LF (GAUZE/BANDAGES/DRESSINGS) IMPLANT
GLOVE BIO SURGEON STRL SZ 6.5 (GLOVE) ×2 IMPLANT
GLOVE BIO SURGEON STRL SZ7 (GLOVE) ×3 IMPLANT
GLOVE BIO SURGEONS STRL SZ 6.5 (GLOVE) ×1
GLOVE BIOGEL PI IND STRL 7.5 (GLOVE) ×1 IMPLANT
GLOVE BIOGEL PI INDICATOR 7.5 (GLOVE) ×2
GOWN STRL REUS W/ TWL LRG LVL3 (GOWN DISPOSABLE) ×2 IMPLANT
GOWN STRL REUS W/TWL LRG LVL3 (GOWN DISPOSABLE) ×4
KIT MARKER MARGIN INK (KITS) ×3 IMPLANT
NEEDLE HYPO 25X1 1.5 SAFETY (NEEDLE) ×3 IMPLANT
NS IRRIG 1000ML POUR BTL (IV SOLUTION) ×3 IMPLANT
PACK BASIN DAY SURGERY FS (CUSTOM PROCEDURE TRAY) ×3 IMPLANT
PENCIL SMOKE EVACUATOR (MISCELLANEOUS) ×3 IMPLANT
SLEEVE SCD COMPRESS KNEE MED (MISCELLANEOUS) ×3 IMPLANT
SPONGE GAUZE 2X2 8PLY STER LF (GAUZE/BANDAGES/DRESSINGS) ×1
SPONGE GAUZE 2X2 8PLY STRL LF (GAUZE/BANDAGES/DRESSINGS) ×2 IMPLANT
SPONGE LAP 4X18 RFD (DISPOSABLE) ×3 IMPLANT
STRIP CLOSURE SKIN 1/2X4 (GAUZE/BANDAGES/DRESSINGS) ×2 IMPLANT
SUT MON AB 4-0 PC3 18 (SUTURE) ×3 IMPLANT
SUT SILK 2 0 SH (SUTURE) IMPLANT
SUT VIC AB 3-0 SH 27 (SUTURE) ×2
SUT VIC AB 3-0 SH 27X BRD (SUTURE) ×1 IMPLANT
SYR BULB EAR ULCER 3OZ GRN STR (SYRINGE) IMPLANT
SYR CONTROL 10ML LL (SYRINGE) ×3 IMPLANT
TOWEL GREEN STERILE FF (TOWEL DISPOSABLE) ×3 IMPLANT
TRAY FAXITRON CT DISP (TRAY / TRAY PROCEDURE) ×3 IMPLANT
TUBE CONNECTING 20'X1/4 (TUBING)
TUBE CONNECTING 20X1/4 (TUBING) IMPLANT
YANKAUER SUCT BULB TIP NO VENT (SUCTIONS) IMPLANT

## 2020-07-05 NOTE — Anesthesia Postprocedure Evaluation (Signed)
Anesthesia Post Note  Patient: Denise Solomon  Procedure(s) Performed: RIGHT BREAST LUMPECTOMY WITH RADIOACTIVE SEED LOCALIZATION (Right Breast)     Patient location during evaluation: PACU Anesthesia Type: General Level of consciousness: awake and alert and oriented Pain management: pain level controlled Vital Signs Assessment: post-procedure vital signs reviewed and stable Respiratory status: spontaneous breathing, nonlabored ventilation and respiratory function stable Cardiovascular status: blood pressure returned to baseline Postop Assessment: no apparent nausea or vomiting Anesthetic complications: no   No complications documented.  Last Vitals:  Vitals:   07/05/20 1010 07/05/20 1011  BP:    Pulse: 71 65  Resp: 16 13  Temp:    SpO2: 100% 100%    Last Pain:  Vitals:   07/05/20 1001  TempSrc:   PainSc: 0-No pain                 Kaylyn Layer

## 2020-07-05 NOTE — H&P (Signed)
History of Present Illness  The patient is a 49 year old female who presents with a breast mass. PCP - Dr. [name]  This is a 49 year old female who presents after recent screening mammogram revealed some calcifications in the right upper outer quadrant. She underwent further workup including biopsy that showed flat epithelial atypia. The patient has no family history of breast cancer. She has never had problems with her breasts before. No previous biopsies. She is referred to [place] to discuss surgical excision.  CLINICAL DATA: Patient recalled from screening for right breast calcifications.  EXAM: DIGITAL DIAGNOSTIC UNILATERAL RIGHT MAMMOGRAM WITH CAD AND TOMO  COMPARISON: Previous exam(s).  ACR Breast Density Category c: The breast tissue is heterogeneously dense, which may obscure small masses.  FINDINGS: Magnification true lateral view of the right breast was obtained. Additionally, CC view and full paddle true lateral tomosynthesis views of the right breast were obtained. According to the tomosynthesis images, the calcifications are located laterally however are not able to be identified on the CC images. Overall there is a 6 mm group of punctate calcifications within the upper-outer right breast.  Mammographic images were processed with CAD.  IMPRESSION: Indeterminate calcifications upper-outer right breast demonstrated on the MLO view only. These are located laterally according to the tomosynthesis images.  RECOMMENDATION: Stereotactic guided core needle biopsy from a lateral approach of the indeterminate calcifications within the upper-outer right breast demonstrated on the ML view only.  I have discussed the findings and recommendations with the patient. If applicable, a reminder letter will be sent to the patient regarding the next appointment.  BI-RADS CATEGORY 4: Suspicious.   Electronically Signed By: [name] M.D.  CLINICAL  DATA: 49 year old female presenting for biopsy of calcifications in the right breast.  EXAM: RIGHT BREAST STEREOTACTIC CORE NEEDLE BIOPSY  COMPARISON: Previous exams.  FINDINGS: The patient and I discussed the procedure of stereotactic-guided biopsy including benefits and alternatives. We discussed the high likelihood of a successful procedure. We discussed the risks of the procedure including infection, bleeding, tissue injury, clip migration, and inadequate sampling. Informed written consent was given. The usual time out protocol was performed immediately prior to the procedure.  Using sterile technique and 1% Lidocaine as local anesthetic, under stereotactic guidance, a 9 gauge vacuum assisted device was used to perform core needle biopsy of calcifications in the upper outer far posterior breast using a lateral approach. Specimen radiograph was performed showing at least 2 specimens with calcifications. Specimens with calcifications are identified for pathology.  Lesion quadrant: Upper outer quadrant  At the conclusion of the procedure, coil tissue marker clip was deployed into the biopsy cavity. Follow-up 2-view mammogram was performed and dictated separately.  IMPRESSION: Stereotactic-guided biopsy of calcifications in the right breast. No apparent complications.  Electronically Signed: By: [name] M.D.  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 This was found to be concordant by Dr. [name].    Past Surgical History  Breast Biopsy Right. Tonsillectomy  Diagnostic Studies History  Colonoscopy never Mammogram within last year Pap Smear 1-5 years ago  Allergies  Bactrim *ANTI-INFECTIVE AGENTS - MISC.* Erythromycin ([facility]) *DERMATOLOGICALS* Penicillins Allergies Reconciled  Medication  History  No Current Medications Medications Reconciled  Social History Caffeine use Carbonated beverages, Coffee, Tea. No alcohol use No drug use Tobacco use Current every day smoker.  Family History  Colon Polyps Mother. Depression Mother. Heart Disease Family Members In  General. Heart disease in female family member before age 59 Ischemic Bowel Disease Mother. Malignant Neoplasm Of Pancreas Family Members In General.  Pregnancy / Birth History  Age at menarche 13 years. Gravida 1 Irregular periods Maternal age 47-20 Para 0  Other Problems  Anxiety Disorder Back Pain Bladder Problems Gastric Ulcer Gastroesophageal Reflux Disease General anesthesia - complications     Review of Systems  General Not Present- Appetite Loss, Chills, Fatigue, Fever, Night Sweats, Weight Gain and Weight Loss. Skin Not Present- Change in Wart/Mole, Dryness, Hives, Jaundice, New Lesions, Non-Healing Wounds, Rash and Ulcer. HEENT Not Present- Earache, Hearing Loss, Hoarseness, Nose Bleed, Oral Ulcers, Ringing in the Ears, Seasonal Allergies, Sinus Pain, Sore Throat, Visual Disturbances, Wears glasses/contact lenses and Yellow Eyes. Respiratory Not Present- Bloody sputum, Chronic Cough, Difficulty Breathing, Snoring and Wheezing. Breast Not Present- Breast Mass, Breast Pain, Nipple Discharge and Skin Changes. Cardiovascular Not Present- Chest Pain, Difficulty Breathing Lying Down, Leg Cramps, Palpitations, Rapid Heart Rate, Shortness of Breath and Swelling of Extremities. Gastrointestinal Not Present- Abdominal Pain, Bloating, Bloody Stool, Change in Bowel Habits, Chronic diarrhea, Constipation, Difficulty Swallowing, Excessive gas, Gets full quickly at meals, Hemorrhoids, Indigestion, Nausea, Rectal Pain and Vomiting. Female Genitourinary Present- Frequency. Not Present- Nocturia, Painful Urination, Pelvic Pain and Urgency. Musculoskeletal Present- Back Pain. Not  Present- Joint Pain, Joint Stiffness, Muscle Pain, Muscle Weakness and Swelling of Extremities. Neurological Present- Numbness. Not Present- Decreased Memory, Fainting, Headaches, Seizures, Tingling, Tremor, Trouble walking and Weakness. Psychiatric Not Present- Anxiety, Bipolar, Change in Sleep Pattern, Depression, Fearful and Frequent crying. Endocrine Not Present- Cold Intolerance, Excessive Hunger, Hair Changes, Heat Intolerance, Hot flashes and New Diabetes. Hematology Not Present- Blood Thinners, Easy Bruising, Excessive bleeding, Gland problems, HIV and Persistent Infections.  Vitals  Weight: 118.13 lb Height: 62in Body Surface Area: 1.53 m Body Mass Index: 21.61 kg/m  Temp.: 98.29F  Pulse: 113 (Regular)  BP: 122/82(Sitting, Left Arm, Standard)        Physical Exam   The physical exam findings are as follows: Note:Constitutional: WDWN in NAD, conversant, no obvious deformities; Eyes: Pupils equal, round; sclera anicteric; moist conjunctiva; no lid lag HENT: Oral mucosa moist; good dentition Neck: No masses palpated, trachea midline; no thyromegaly Breasts: Symmetric, bilateral fibrocystic changes, no dominant masses. No nipple changes. No nipple discharge. No axillary lymphadenopathy. Lungs: CTA bilaterally; normal respiratory effort CV: Regular rate and rhythm; no murmurs; extremities well-perfused with no edema Abd: +bowel sounds, soft, non-tender, no palpable organomegaly; no palpable hernias Musc: Normal gait; no apparent clubbing or cyanosis in extremities Lymphatic: No palpable cervical or axillary lymphadenopathy Skin: Warm, dry; no sign of jaundice Psychiatric - alert and oriented x 4; calm mood and affect    Assessment & Plan   MASS OF RIGHT BREAST ON MAMMOGRAM (N63.10) Impression: Flat epithelial atypia - RUOQ  Current Plans Schedule for Surgery - Right radioactive seed localized lumpectomy. The surgical procedure has been  discussed with the patient. Potential risks, benefits, alternative treatments, and expected outcomes have been explained. All of the patient's questions at this time have been answered. The likelihood of reaching the patient's treatment goal is good. The patient understand the proposed surgical procedure and wishes to proceed. Note:We discussed the fact that flat epithelial atypia only increases her overall risk of developing breast cancer by a couple percentage points. However conservative some fragments would possibly require future biopsies to confirm that the area is not progressing. The patient prefers to have surgery at this time to remove this area.  Wilmon Arms. Corliss Skains, MD, [facility]  General/ Trauma Surgery   07/05/2020 7:32 AM

## 2020-07-05 NOTE — Anesthesia Preprocedure Evaluation (Addendum)
Anesthesia Evaluation  Patient identified by MRN, date of birth, ID band Patient awake    Reviewed: Allergy & Precautions, NPO status , Patient's Chart, lab work & pertinent test results  History of Anesthesia Complications Negative for: history of anesthetic complications  Airway Mallampati: II  TM Distance: >3 FB Neck ROM: Full    Dental no notable dental hx.    Pulmonary Current Smoker and Patient abstained from smoking.,    Pulmonary exam normal        Cardiovascular negative cardio ROS Normal cardiovascular exam     Neuro/Psych negative neurological ROS  negative psych ROS   GI/Hepatic negative GI ROS, Neg liver ROS,   Endo/Other  negative endocrine ROS  Renal/GU negative Renal ROS  negative genitourinary   Musculoskeletal negative musculoskeletal ROS (+)   Abdominal   Peds  Hematology negative hematology ROS (+)   Anesthesia Other Findings Right breast ca  Reproductive/Obstetrics negative OB ROS                            Anesthesia Physical Anesthesia Plan  ASA: II  Anesthesia Plan: General   Post-op Pain Management:    Induction: Intravenous  PONV Risk Score and Plan: 3 and Treatment may vary due to age or medical condition, Ondansetron, Dexamethasone and Midazolam  Airway Management Planned: LMA  Additional Equipment: None  Intra-op Plan:   Post-operative Plan: Extubation in OR  Informed Consent: I have reviewed the patients History and Physical, chart, labs and discussed the procedure including the risks, benefits and alternatives for the proposed anesthesia with the patient or authorized representative who has indicated his/her understanding and acceptance.     Dental advisory given  Plan Discussed with: CRNA  Anesthesia Plan Comments:        Anesthesia Quick Evaluation

## 2020-07-05 NOTE — Op Note (Signed)
Pre-op Diagnosis:  Right breast flat epithelial atypia Post-op Diagnosis: same Procedure:  Right radioactive seed localized lumpectomy Surgeon:  Kodi Guerrera K. Anesthesia:  GEN - LMA Indications:  This is a 49 year old female who presents after recent screening mammogram revealed some calcifications in the right upper outer quadrant, posterior depth. She underwent further workup including biopsy that showed flat epithelial atypia. The patient has no family history of breast cancer. She has never had problems with her breasts before. No previous biopsies. Radioactive seed was placed yesterday.  Description of procedure: The patient is brought to the operating room placed in supine position on the operating room table. After an adequate level of general anesthesia was obtained, her right breast was prepped with ChloraPrep and draped in sterile fashion. A timeout was taken to ensure the proper patient and proper procedure. We interrogated the breast with the neoprobe. We made a circumareolar incision around the upper side of the nipple after infiltrating with 0.25% Marcaine. Dissection was carried down in the breast tissue with cautery. We used the neoprobe to guide [place] towards the radioactive seed, which was located at the deepest part of the breast We excised an area of tissue around the radioactive seed 1.5 cm in diameter. The specimen was removed and was oriented with a paint kit. I interrogated the breast again and the seed was located behind the fascia of the pectoralis muscle.  I excised a 1.5 cm area of pectoralis fascia.  Specimen mammogram showed the radioactive seed as well as the biopsy clip within the fascial specimen. No seed or clip was noted in the lumpectomy specimen.  These were both sent for pathologic examination. There is no residual radioactivity within the biopsy cavity. We inspected carefully for hemostasis. The wound was thoroughly irrigated. The wound was closed with a deep layer  of 3-0 Vicryl and a subcuticular layer of 4-0 Monocryl. Benzoin Steri-Strips were applied. The patient was then extubated and brought to the recovery room in stable condition. All sponge, instrument, and needle counts are correct.  Denise Solomon. Denise Dover, MD, [facility] Surgery  General/ Trauma Surgery  07/05/2020 9:41 AM

## 2020-07-05 NOTE — Transfer of Care (Signed)
Immediate Anesthesia Transfer of Care Note  Patient: Denise Solomon  Procedure(s) Performed: RIGHT BREAST LUMPECTOMY WITH RADIOACTIVE SEED LOCALIZATION (Right Breast)  Patient Location: PACU  Anesthesia Type:General  Level of Consciousness: drowsy  Airway & Oxygen Therapy: Patient Spontanous Breathing and Patient connected to nasal cannula oxygen  Post-op Assessment: Report given to RN and Post -op Vital signs reviewed and stable  Post vital signs: Reviewed and stable  Last Vitals:  Vitals Value Taken Time  BP 158/103 07/05/20 0946  Temp    Pulse 95 07/05/20 0947  Resp 12 07/05/20 0947  SpO2 100 % 07/05/20 0947  Vitals shown include unvalidated device data.  Last Pain:  Vitals:   07/05/20 0730  TempSrc: Oral  PainSc: 0-No pain         Complications: No complications documented.

## 2020-07-05 NOTE — Discharge Instructions (Signed)
[place] Office Phone Number [phone]  BREAST BIOPSY/ PARTIAL MASTECTOMY: POST OP INSTRUCTIONS  Always review your discharge instruction sheet given to you by the facility where your surgery was performed.  IF YOU HAVE DISABILITY OR FAMILY LEAVE FORMS, YOU MUST BRING THEM TO THE OFFICE FOR PROCESSING.  DO NOT GIVE THEM TO YOUR DOCTOR.  1. A prescription for pain medication may be given to you upon discharge.  Take your pain medication as prescribed, if needed.  If narcotic pain medicine is not needed, then you may take acetaminophen (Tylenol) or ibuprofen (Advil) as needed. 2. Take your usually prescribed medications unless otherwise directed 3. If you need a refill on your pain medication, please contact your pharmacy.  They will contact our office to request authorization.  Prescriptions will not be filled after 5pm or on week-ends. 4. You should eat very light the first 24 hours after surgery, such as soup, crackers, pudding, etc.  Resume your normal diet the day after surgery. 5. Most patients will experience some swelling and bruising in the breast.  Ice packs and a good support bra will help.  Swelling and bruising can take several days to resolve.  6. It is common to experience some constipation if taking pain medication after surgery.  Increasing fluid intake and taking a stool softener will usually help or prevent this problem from occurring.  A mild laxative (Milk of Magnesia or Miralax) should be taken according to package directions if there are no bowel movements after 48 hours. 7. Unless discharge instructions indicate otherwise, you may remove your bandages 24-48 hours after surgery, and you may shower at that time.  You may have steri-strips (small skin tapes) in place directly over the incision.  These strips should be left on the skin for 7-10 days.  If your surgeon used skin glue on the incision, you may shower in 24 hours.  The glue will flake off over the  next 2-3 weeks.  Any sutures or staples will be removed at the office during your follow-up visit. 8. ACTIVITIES:  You may resume regular daily activities (gradually increasing) beginning the next day.  Wearing a good support bra or sports bra minimizes pain and swelling.  You may have sexual intercourse when it is comfortable. a. You may drive when you no longer are taking prescription pain medication, you can comfortably wear a seatbelt, and you can safely maneuver your car and apply brakes. b. RETURN TO WORK:  ______________________________________________________________________________________ 9. You should see your doctor in the office for a follow-up appointment approximately two weeks after your surgery.  Your doctor's nurse will typically make your follow-up appointment when she calls you with your pathology report.  Expect your pathology report 2-3 business days after your surgery.  You may call to check if you do not hear from [place] after three days. 10. OTHER INSTRUCTIONS: _______________________________________________________________________________________________ _____________________________________________________________________________________________________________________________________ _____________________________________________________________________________________________________________________________________ _____________________________________________________________________________________________________________________________________  WHEN TO CALL YOUR DOCTOR: 1. Fever over 101.0 2. Nausea and/or vomiting. 3. Extreme swelling or bruising. 4. Continued bleeding from incision. 5. Increased pain, redness, or drainage from the incision.  The clinic staff is available to answer your questions during regular business hours.  Please don't hesitate to call and ask to speak to one of the nurses for clinical concerns.  If you have a medical emergency, go to the nearest  emergency room or call 911.  A surgeon from [facility] is always on call at the hospital.  For further questions, please visit [email or web address]  Tylenol given at 7:45 this morning. Next dose if needed 1:45 this afternoon if needed.   Post Anesthesia Home Care Instructions  Activity: Get plenty of rest for the remainder of the day. A responsible individual must stay with you for 24 hours following the procedure.  For the next 24 hours, DO NOT: -Drive a car -Advertising copywriter -Drink alcoholic beverages -Take any medication unless instructed by your physician -Make any legal decisions or sign important papers.  Meals: Start with liquid foods such as gelatin or soup. Progress to regular foods as tolerated. Avoid greasy, spicy, heavy foods. If nausea and/or vomiting occur, drink only clear liquids until the nausea and/or vomiting subsides. Call your physician if vomiting continues.  Special Instructions/Symptoms: Your throat may feel dry or sore from the anesthesia or the breathing tube placed in your throat during surgery. If this causes discomfort, gargle with warm salt water. The discomfort should disappear within 24 hours.  If you had a scopolamine patch placed behind your ear for the management of post- operative nausea and/or vomiting:  1. The medication in the patch is effective for 72 hours, after which it should be removed.  Wrap patch in a tissue and discard in the trash. Wash hands thoroughly with soap and water. 2. You may remove the patch earlier than 72 hours if you experience unpleasant side effects which may include dry mouth, dizziness or visual disturbances. 3. Avoid touching the patch. Wash your hands with soap and water after contact with the patch.

## 2020-07-05 NOTE — Anesthesia Procedure Notes (Signed)
Procedure Name: LMA Insertion Date/Time: 07/05/2020 8:52 AM Performed by: Elliot Dally, CRNA Pre-anesthesia Checklist: Patient identified, Emergency Drugs available, Suction available and Patient being monitored Patient Re-evaluated:Patient Re-evaluated prior to induction Oxygen Delivery Method: Circle System Utilized Preoxygenation: Pre-oxygenation with 100% oxygen Induction Type: IV induction Ventilation: Mask ventilation without difficulty LMA: LMA inserted LMA Size: 4.0 Number of attempts: 1 Airway Equipment and Method: Bite block Placement Confirmation: positive ETCO2 Tube secured with: Tape Dental Injury: Teeth and Oropharynx as per pre-operative assessment

## 2020-07-06 ENCOUNTER — Encounter (HOSPITAL_BASED_OUTPATIENT_CLINIC_OR_DEPARTMENT_OTHER): Payer: Self-pay | Admitting: Surgery

## 2020-07-06 LAB — SURGICAL PATHOLOGY

## 2021-02-15 ENCOUNTER — Other Ambulatory Visit: Payer: Self-pay | Admitting: Family Medicine

## 2021-02-15 DIAGNOSIS — Z1231 Encounter for screening mammogram for malignant neoplasm of breast: Secondary | ICD-10-CM

## 2021-04-06 ENCOUNTER — Ambulatory Visit
Admission: RE | Admit: 2021-04-06 | Discharge: 2021-04-06 | Disposition: A | Discharge status: Home / Self Care | Payer: BC Managed Care – PPO | Source: Ambulatory Visit | Attending: Family Medicine | Admitting: Family Medicine

## 2021-04-06 ENCOUNTER — Other Ambulatory Visit: Payer: Self-pay

## 2021-04-06 DIAGNOSIS — Z1231 Encounter for screening mammogram for malignant neoplasm of breast: Secondary | ICD-10-CM

## 2021-05-16 ENCOUNTER — Ambulatory Visit
Admission: RE | Admit: 2021-05-16 | Discharge: 2021-05-16 | Disposition: A | Discharge status: Home / Self Care | Payer: BC Managed Care – PPO | Source: Ambulatory Visit | Attending: Emergency Medicine | Admitting: Emergency Medicine

## 2021-05-16 ENCOUNTER — Other Ambulatory Visit: Payer: Self-pay

## 2021-05-16 VITALS — BP 146/88 | HR 89 | Temp 98.7°F | Resp 18

## 2021-05-16 DIAGNOSIS — W57XXXA Bitten or stung by nonvenomous insect and other nonvenomous arthropods, initial encounter: Secondary | ICD-10-CM | POA: Diagnosis not present

## 2021-05-16 DIAGNOSIS — S70262A Insect bite (nonvenomous), left hip, initial encounter: Secondary | ICD-10-CM

## 2021-05-16 MED ORDER — DOXYCYCLINE HYCLATE 100 MG PO CAPS: 100.0000 mg | ORAL_CAPSULE | Freq: Two times a day (BID) | ORAL | 0 refills | Status: AC

## 2021-05-16 NOTE — ED Triage Notes (Signed)
Pt states was bite by a tick to lt hip/buttocks are a week ago. States now having swollen lymph nodes to crease to lt upper leg and redness around tick bite. States the area is very itchy. States this happen last year (03/2020) and tx with doxy from PCP. States taking benadryl for the itching.

## 2021-05-16 NOTE — ED Provider Notes (Signed)
[facility]  ____________________________________________  Time seen: Approximately 5:58 PM  I have reviewed the triage vital signs and the nursing notes.   HISTORY  Chief Complaint appt 6 tick bite   Historian Patient     HPI Denise Solomon is a 50 y.o. female presents to the urgent care after patient was bitten by a tick approximately 1 week ago.  Patient has a 2 cm region of circumferential cellulitis surrounding tick bite.  Patient states that she has had itching and inflamed lymph nodes along the left groin.  Denies headache, chills, body aches, headache or vomiting.  Denies possibility of pregnancy.   History reviewed. No pertinent past medical history.   Immunizations up to date:  Yes.     History reviewed. No pertinent past medical history.  There are no problems to display for this patient.   Past Surgical History:  Procedure Laterality Date  . BREAST BIOPSY Right 03/16/2020   FLAT EPITHELIAL ATYPIA WITH CALCIFICATIONS,  . BREAST EXCISIONAL BIOPSY Right 07/05/2020    Fibrocystic change with papillary apocrine metaplasia.   Marland Kitchen BREAST LUMPECTOMY WITH RADIOACTIVE SEED LOCALIZATION Right 07/05/2020   Procedure: RIGHT BREAST LUMPECTOMY WITH RADIOACTIVE SEED LOCALIZATION;  Surgeon: Manus Rudd, MD;  Location: [facility];  Service: General;  Laterality: Right;  LMA  . EXTERNAL FIXATION ANKLE FRACTURE    . TONSILLECTOMY      Prior to Admission medications   Medication Sig Start Date End Date Taking? Authorizing Provider  doxycycline (VIBRAMYCIN) 100 MG capsule Take 1 capsule (100 mg total) by mouth 2 (two) times daily for 7 days. 05/16/21 05/23/21 Yes Pia Mau M, PA-C  HYDROcodone-acetaminophen (NORCO/VICODIN) 5-325 MG tablet Take 1 tablet by mouth every 6 (six) hours as needed for moderate pain. 07/05/20   Manus Rudd, MD  Multiple Vitamins-Minerals (MULTIVITAMIN GUMMIES WOMENS) CHEW Chew by mouth.    [provider]     Allergies Erythromycin and Penicillins  History reviewed. No pertinent family history.  Social History Social History   Tobacco Use  . Smoking status: Current Every Day Smoker    Packs/day: 0.50    Types: Cigarettes  . Smokeless tobacco: Never Used  Vaping Use  . Vaping Use: Never used  Substance Use Topics  . Alcohol use: Not Currently  . Drug use: Never     Review of Systems  Constitutional: No fever/chills Eyes:  No discharge ENT: No upper respiratory complaints. Respiratory: no cough. No SOB/ use of accessory muscles to breath Gastrointestinal:   No nausea, no vomiting.  No diarrhea.  No constipation. Musculoskeletal: Negative for musculoskeletal pain. Skin: Patient has tick bite.     ____________________________________________   PHYSICAL EXAM:  VITAL SIGNS: ED Triage Vitals [05/16/21 1749]  Enc Vitals Group     BP (!) 146/88     Pulse Rate 89     Resp 18     Temp 98.7 F (37.1 C)     Temp Source Oral     SpO2 99 %     Weight      Height      Head Circumference      Peak Flow      Pain Score 0     Pain Loc      Pain Edu?      Excl. in GC?      Constitutional: Alert and oriented. Well appearing and in no acute distress. Eyes: Conjunctivae are normal. PERRL. EOMI. Head: Atraumatic. ENT:      Nose:  No congestion/rhinnorhea.      Mouth/Throat: Mucous membranes are moist.  Neck: No stridor.  No cervical spine tenderness to palpation. Cardiovascular: Normal rate, regular rhythm. Normal S1 and S2.  Good peripheral circulation. Respiratory: Normal respiratory effort without tachypnea or retractions. Lungs CTAB. Good air entry to the bases with no decreased or absent breath sounds Gastrointestinal: Bowel sounds x 4 quadrants. Soft and nontender to palpation. No guarding or rigidity. No distention. Musculoskeletal: Full range of motion to all extremities. No obvious deformities noted Neurologic:  Normal for age. No gross focal neurologic deficits  are appreciated.  Skin: Patient has tick bite wound of left hip with approximately 2 cm of surrounding cellulitis.  No palpable induration or fluctuance. Psychiatric: Mood and affect are normal for age. Speech and behavior are normal.   ____________________________________________   LABS (all labs ordered are listed, but only abnormal results are displayed)  Labs Reviewed - No data to display ____________________________________________  EKG   ____________________________________________  RADIOLOGY   No results found.  ____________________________________________    PROCEDURES  Procedure(s) performed:     Procedures     Medications - No data to display   ____________________________________________   INITIAL IMPRESSION / ASSESSMENT AND PLAN / ED COURSE  Pertinent labs & imaging results that were available during my care of the patient were reviewed by me and considered in my medical decision making (see chart for details).      Assessment and plan Tick bite 50 year old female presents to the urgent care after she was bitten by a tick.  Vital signs are reassuring at triage.  On physical exam, patient was alert, active and nontoxic-appearing.  Patient declined possibility of pregnancy.  Will treat with Doxy twice daily for the next 7 days.  Recommended a general probiotic with use of Doxy.  All patient questions were answered.      ____________________________________________  FINAL CLINICAL IMPRESSION(S) / ED DIAGNOSES  Final diagnoses:  Tick bite of left hip, initial encounter      NEW MEDICATIONS STARTED DURING THIS VISIT:  ED Discharge Orders         Ordered    doxycycline (VIBRAMYCIN) 100 MG capsule  2 times daily        05/16/21 1754              This chart was dictated using voice recognition software/Dragon. Despite best efforts to proofread, errors can occur which can change the meaning. Any change was purely  unintentional.     Orvil Feil, PA-C 05/16/21 1801

## 2021-05-16 NOTE — Discharge Instructions (Signed)
Take doxy twice daily for seven days.

## 2021-07-03 ENCOUNTER — Encounter (HOSPITAL_COMMUNITY): Payer: Self-pay

## 2021-08-11 IMAGING — MG MM DIGITAL DIAGNOSTIC UNILAT*R* W/ TOMO W/ CAD
6 series · 6 of 10 positions shown · non-contrast
Comparison: Previous exam(s).

CLINICAL DATA: Patient recalled from screening for right breast
calcifications.

EXAM:
DIGITAL DIAGNOSTIC UNILATERAL RIGHT MAMMOGRAM WITH CAD AND TOMO

[R ML (1 of 2)]
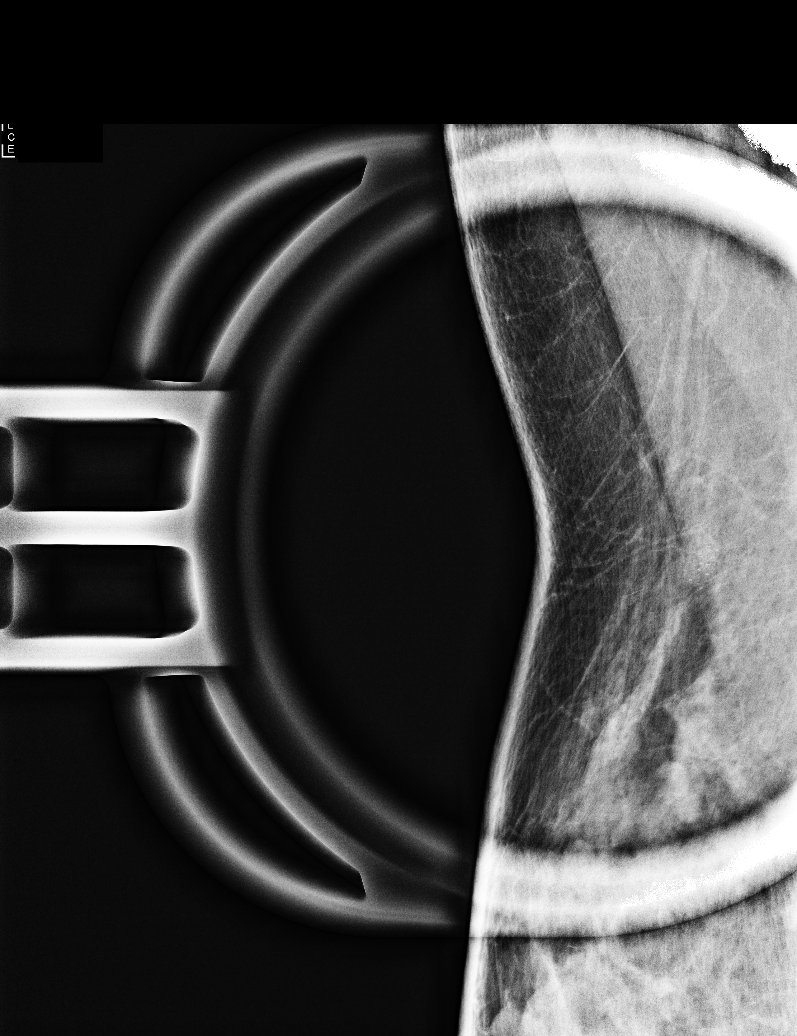

[R XCCM]
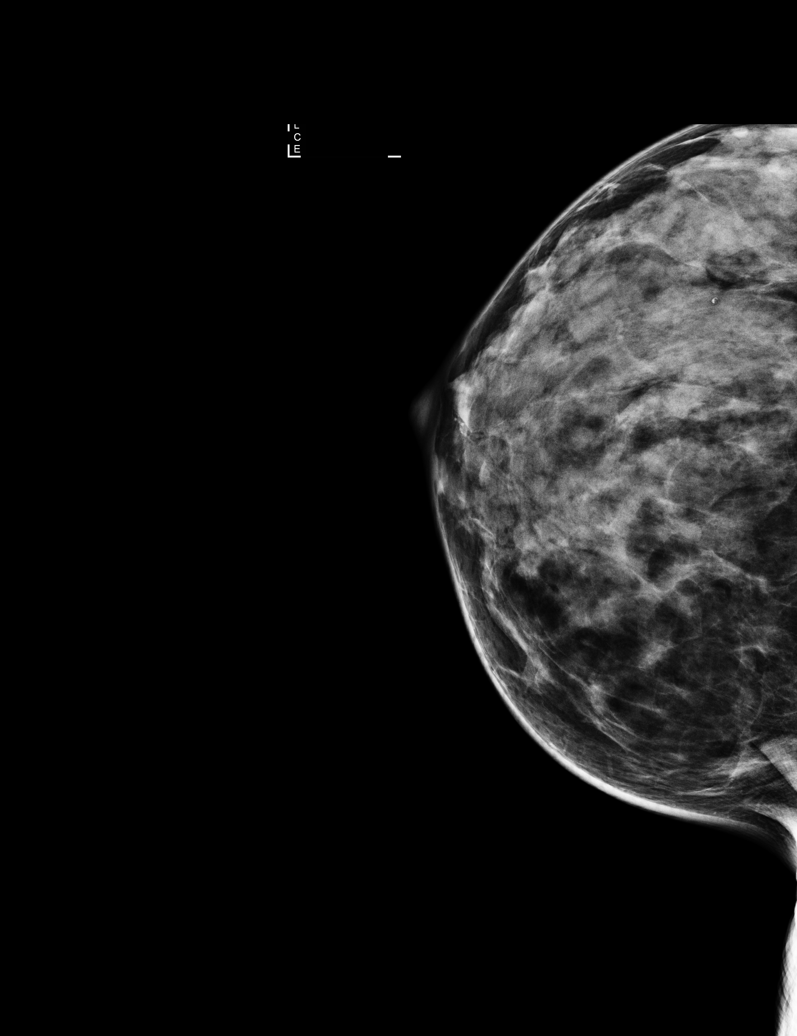

[R CC]
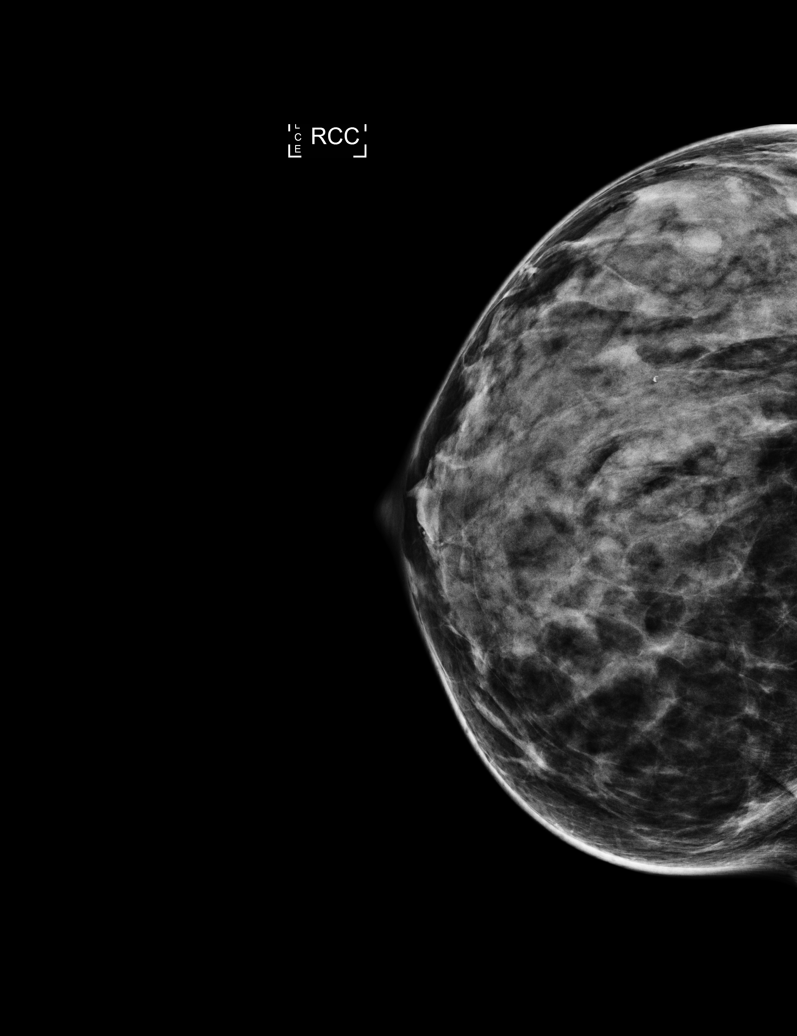

[R ML (2 of 2)]
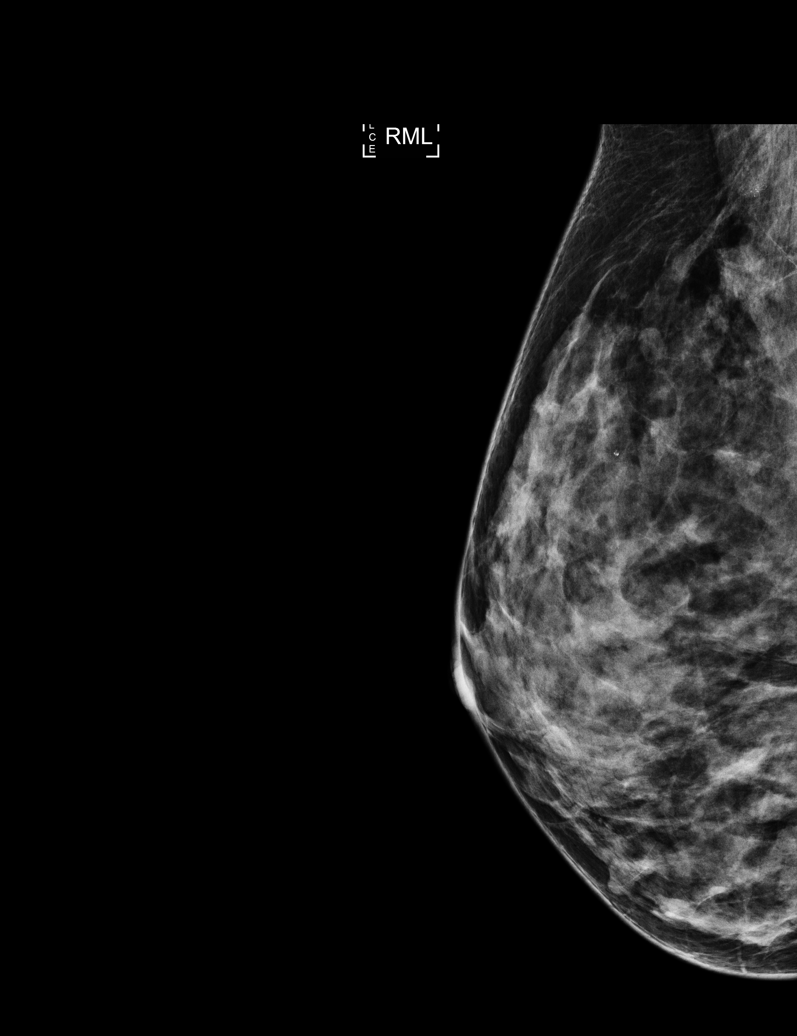

[R ML synth-2D]
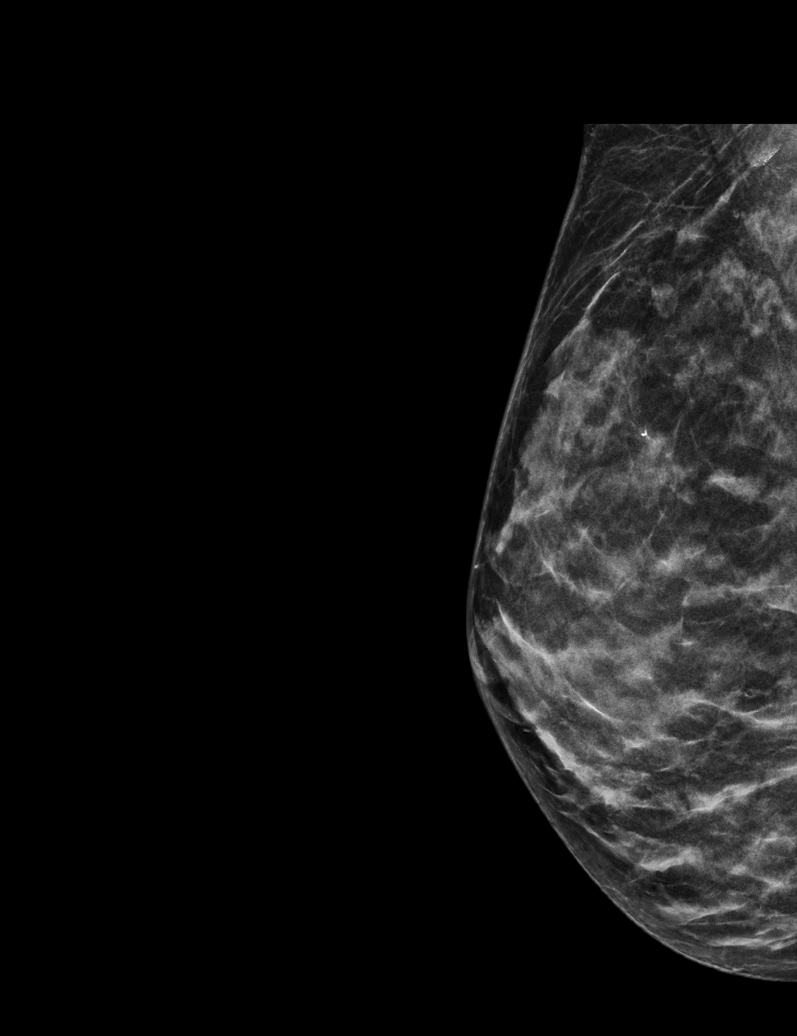

[R ML tomo · tomo slice 21/42.0]
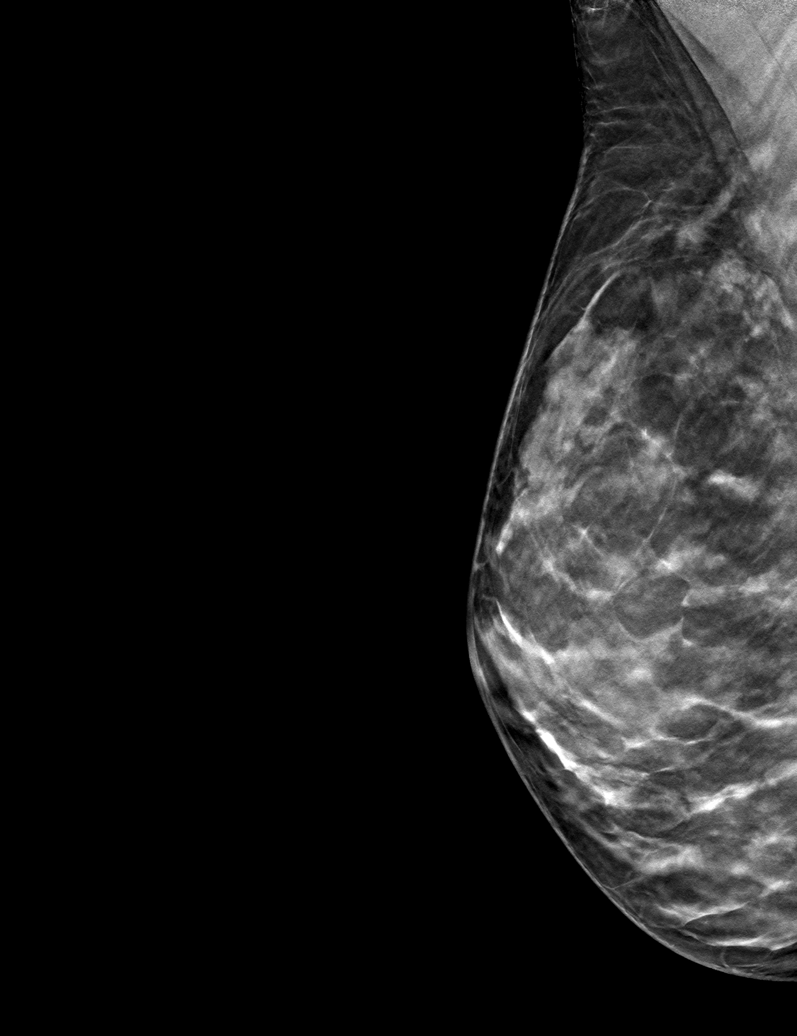

[6 of 10 positions shown; findings below may reference images not displayed]

ACR Breast Density Category c: The breast tissue is heterogeneously
dense, which may obscure small masses.
FINDINGS: Magnification true lateral view of the right breast was obtained.
Additionally, CC view and full paddle true lateral tomosynthesis
views of the right breast were obtained. According to the
tomosynthesis images, the calcifications are located laterally
however are not able to be identified on the CC images. Overall
there is a 6 mm group of punctate calcifications within the
upper-outer right breast.

Mammographic images were processed with CAD.
IMPRESSION: Indeterminate calcifications upper-outer right breast demonstrated
on the MLO view only. These are located laterally according to the
tomosynthesis images.

RECOMMENDATION:
Stereotactic guided core needle biopsy from a lateral approach of
the indeterminate calcifications within the upper-outer right breast
demonstrated on the ML view only.

I have discussed the findings and recommendations with the patient.
If applicable, a reminder letter will be sent to the patient
regarding the next appointment.

BI-RADS CATEGORY  4: Suspicious.

## 2021-08-30 ENCOUNTER — Ambulatory Visit
Admission: RE | Admit: 2021-08-30 | Discharge: 2021-08-30 | Disposition: A | Discharge status: Home / Self Care | Payer: BC Managed Care – PPO | Source: Ambulatory Visit | Attending: Urgent Care | Admitting: Urgent Care

## 2021-08-30 ENCOUNTER — Other Ambulatory Visit: Payer: Self-pay

## 2021-08-30 VITALS — BP 142/89 | HR 66 | Temp 98.9°F | Resp 18

## 2021-08-30 DIAGNOSIS — M25531 Pain in right wrist: Secondary | ICD-10-CM | POA: Diagnosis not present

## 2021-08-30 DIAGNOSIS — M79641 Pain in right hand: Secondary | ICD-10-CM

## 2021-08-30 DIAGNOSIS — S66911A Strain of unspecified muscle, fascia and tendon at wrist and hand level, right hand, initial encounter: Secondary | ICD-10-CM

## 2021-08-30 MED ORDER — NAPROXEN 375 MG PO TABS: 375.0000 mg | ORAL_TABLET | Freq: Two times a day (BID) | ORAL | 0 refills | Status: AC

## 2021-08-30 NOTE — ED Provider Notes (Signed)
[facility]   MRN: [id number] DOB: 1971-06-29  Subjective:   Denise Solomon is a 50 y.o. female presenting for 3-day history of acute onset persistent right hand, wrist pain that radiates into the forearm.  Patient was at work, had to lift a cooler that was very heavy and ended up doing this in an awkward way.  Denies fall, trauma, bruising, swelling, warmth, bony deformity.  Has used Tylenol and a wrist brace with minimal relief.  She has continued to work despite her pain.  No current facility-administered medications for this encounter.  Current Outpatient Medications:  .  HYDROcodone-acetaminophen (NORCO/VICODIN) 5-325 MG tablet, Take 1 tablet by mouth every 6 (six) hours as needed for moderate pain., Disp: 15 tablet, Rfl: 0 .  Multiple Vitamins-Minerals (MULTIVITAMIN GUMMIES WOMENS) CHEW, Chew by mouth., Disp: , Rfl:    Allergies  Allergen Reactions  . Erythromycin Rash  . Penicillins Rash    No past medical history on file.   Past Surgical History:  Procedure Laterality Date  . BREAST BIOPSY Right 03/16/2020   FLAT EPITHELIAL ATYPIA WITH CALCIFICATIONS,  . BREAST EXCISIONAL BIOPSY Right 07/05/2020    Fibrocystic change with papillary apocrine metaplasia.   Marland Kitchen BREAST LUMPECTOMY WITH RADIOACTIVE SEED LOCALIZATION Right 07/05/2020   Procedure: RIGHT BREAST LUMPECTOMY WITH RADIOACTIVE SEED LOCALIZATION;  Surgeon: Manus Rudd, MD;  Location: [facility];  Service: General;  Laterality: Right;  LMA  . EXTERNAL FIXATION ANKLE FRACTURE    . TONSILLECTOMY      No family history on file.  Social History   Tobacco Use  . Smoking status: Every Day    Packs/day: 0.50    Types: Cigarettes  . Smokeless tobacco: Never  Vaping Use  . Vaping Use: Never used  Substance Use Topics  . Alcohol use: Not Currently  . Drug use: Never    ROS   Objective:   Vitals: BP (!) 142/89 (BP Location: Left Arm)   Pulse 66   Temp 98.9 F (37.2 C) (Oral)    Resp 18   SpO2 99%   Physical Exam Constitutional:      General: She is not in acute distress.    Appearance: Normal appearance. She is well-developed. She is not ill-appearing, toxic-appearing or diaphoretic.  HENT:     Head: Normocephalic and atraumatic.     Nose: Nose normal.     Mouth/Throat:     Mouth: Mucous membranes are moist.     Pharynx: Oropharynx is clear.  Eyes:     General: No scleral icterus.       Right eye: No discharge.        Left eye: No discharge.     Extraocular Movements: Extraocular movements intact.     Conjunctiva/sclera: Conjunctivae normal.     Pupils: Pupils are equal, round, and reactive to light.  Cardiovascular:     Rate and Rhythm: Normal rate.  Pulmonary:     Effort: Pulmonary effort is normal.  Musculoskeletal:     Right wrist: No swelling, deformity, effusion, lacerations, tenderness, bony tenderness, snuff box tenderness or crepitus. Normal range of motion.     Right hand: Tenderness present. No swelling, deformity, lacerations or bony tenderness. Normal range of motion. Normal strength. Normal sensation. Normal capillary refill.       Hands:  Skin:    General: Skin is warm and dry.  Neurological:     General: No focal deficit present.     Mental Status: She is alert and oriented  to person, place, and time.     Motor: No weakness.     Coordination: Coordination normal.     Gait: Gait normal.     Deep Tendon Reflexes: Reflexes normal.  Psychiatric:        Mood and Affect: Mood normal.        Behavior: Behavior normal.        Thought Content: Thought content normal.        Judgment: Judgment normal.     Assessment and Plan :   PDMP not reviewed this encounter.  1. Right hand pain   2. Hand strain, right, initial encounter   3. Right wrist pain     Counseled patient that she strained her hand at work and recommended using naproxen, general RICE method.  Provided her with a note for work.  Naproxen for pain and inflammation.  I  applied a 2 inch Ace wrap to the right hand and wrist.  Counseled on appropriate use of this.  Deferred imaging given excellent physical exam findings.   Wallis Bamberg, [place] 08/30/21 [phone]

## 2021-08-30 NOTE — ED Triage Notes (Signed)
Pt c/o pain to rt last two fingers radiating to wrist and certain movements radiating up to elbow since Monday carrying something heavy at work.

## 2022-03-07 ENCOUNTER — Other Ambulatory Visit: Payer: Self-pay | Admitting: Family Medicine

## 2022-03-07 ENCOUNTER — Other Ambulatory Visit: Payer: Self-pay | Admitting: Emergency Medicine

## 2022-03-07 DIAGNOSIS — Z1231 Encounter for screening mammogram for malignant neoplasm of breast: Secondary | ICD-10-CM

## 2022-04-12 ENCOUNTER — Ambulatory Visit (INDEPENDENT_AMBULATORY_CARE_PROVIDER_SITE_OTHER): Payer: 59

## 2022-04-12 DIAGNOSIS — Z1231 Encounter for screening mammogram for malignant neoplasm of breast: Secondary | ICD-10-CM

## 2022-05-16 ENCOUNTER — Ambulatory Visit (INDEPENDENT_AMBULATORY_CARE_PROVIDER_SITE_OTHER): Payer: 59

## 2022-05-16 ENCOUNTER — Ambulatory Visit: Payer: 59 | Admitting: Orthopaedic Surgery

## 2022-05-16 DIAGNOSIS — M25562 Pain in left knee: Secondary | ICD-10-CM

## 2022-05-16 DIAGNOSIS — G8929 Other chronic pain: Secondary | ICD-10-CM | POA: Diagnosis not present

## 2022-05-16 MED ORDER — DICLOFENAC SODIUM 75 MG PO TBEC: 75.0000 mg | DELAYED_RELEASE_TABLET | Freq: Two times a day (BID) | ORAL | 2 refills | Status: AC

## 2022-05-16 NOTE — Progress Notes (Signed)
Office Visit Note   Patient: Denise Solomon Solomon           Date of Birth: July 20, 1971           MRN: [id number] Visit Date: 05/16/2022              Requested by: Milus Banister, MD [address] [place],  [place] [address] PCP: Milus Banister, MD   Assessment & Plan: Visit Diagnoses:  1. Chronic pain of left knee     Plan: Impression is chondromalacia patella left knee.  Treatment options were explained.  She will continue to wear the knee brace at work.  Recommend Voltaren gel.  Activity modification and relative rest.  Prescription for oral diclofenac.  Follow-up as needed.  Follow-Up Instructions: No follow-ups on file.   Orders:  Orders Placed This Encounter  Procedures   XR KNEE 3 VIEW LEFT   Meds ordered this encounter  Medications   diclofenac (VOLTAREN) 75 MG EC tablet    Sig: Take 1 tablet (75 mg total) by mouth 2 (two) times daily.    Dispense:  30 tablet    Refill:  2      Procedures: No procedures performed   Clinical Data: No additional findings.   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  Denies injuries or previous surgeries.  She has been using a lot of stairs at home.  Pain is to the anterior aspect of the knee and around the patella.  Denies any mechanical symptoms or swelling.  Review of Systems  Constitutional: Negative.   HENT: Negative.    Eyes: Negative.   Respiratory: Negative.    Cardiovascular: Negative.   Endocrine: Negative.   Musculoskeletal: Negative.   Neurological: Negative.   Hematological: Negative.   Psychiatric/Behavioral: Negative.    All other systems reviewed and are negative.   Objective: Vital Signs: There were no vitals taken for this visit.  Physical Exam Vitals and nursing note reviewed.  Constitutional:       Appearance: She is well-developed.  HENT:     Head: Normocephalic and atraumatic.  Pulmonary:     Effort: Pulmonary effort is normal.  Abdominal:     Palpations: Abdomen is soft.  Musculoskeletal:     Cervical back: Neck supple.  Skin:    General: Skin is warm.     Capillary Refill: Capillary refill takes less than 2 seconds.  Neurological:     Mental Status: She is alert and oriented to person, place, and time.  Psychiatric:        Behavior: Behavior normal.        Thought Content: Thought content normal.        Judgment: Judgment normal.    Ortho Exam Examination left knee shows no joint effusion.  Good range of motion.  No joint line tenderness.  Collaterals and cruciates are stable.  Slight crepitus with range of motion. Specialty Comments:  No specialty comments available.  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    PMFS History: There are no problems to display for this patient.  No past medical history on file.  No family history on file.  Past Surgical History:  Procedure Laterality Date  BREAST BIOPSY Right 03/16/2020   FLAT EPITHELIAL ATYPIA WITH CALCIFICATIONS,   BREAST EXCISIONAL BIOPSY Right 07/05/2020    Fibrocystic change with papillary apocrine metaplasia.    BREAST LUMPECTOMY WITH RADIOACTIVE SEED LOCALIZATION Right 07/05/2020   Procedure: RIGHT BREAST LUMPECTOMY WITH RADIOACTIVE SEED LOCALIZATION;  Surgeon: Manus Rudd, MD;  Location: [facility];  Service: General;  Laterality: Right;  LMA   EXTERNAL FIXATION ANKLE FRACTURE     TONSILLECTOMY     Social History   Occupational History   Not on file  Tobacco Use   Smoking status: Every Day    Packs/day: 0.50    Types: Cigarettes   Smokeless tobacco: Never  Vaping Use   Vaping Use: Never used  Substance and Sexual Activity   Alcohol use: Not Currently   Drug use: Never   Sexual activity: Not on  file

## 2023-02-27 ENCOUNTER — Other Ambulatory Visit: Payer: Self-pay | Admitting: Family Medicine

## 2023-02-27 DIAGNOSIS — Z1231 Encounter for screening mammogram for malignant neoplasm of breast: Secondary | ICD-10-CM

## 2023-04-17 ENCOUNTER — Ambulatory Visit
Admission: RE | Admit: 2023-04-17 | Discharge: 2023-04-17 | Discharge status: Home / Self Care | Payer: BC Managed Care – PPO | Source: Ambulatory Visit | Attending: Family Medicine | Admitting: Family Medicine

## 2023-04-17 DIAGNOSIS — Z1231 Encounter for screening mammogram for malignant neoplasm of breast: Secondary | ICD-10-CM

## 2023-04-19 ENCOUNTER — Other Ambulatory Visit: Payer: Self-pay | Admitting: Family Medicine

## 2023-04-19 DIAGNOSIS — R928 Other abnormal and inconclusive findings on diagnostic imaging of breast: Secondary | ICD-10-CM

## 2023-05-02 ENCOUNTER — Ambulatory Visit
Admission: RE | Admit: 2023-05-02 | Discharge: 2023-05-02 | Disposition: A | Discharge status: Home / Self Care | Payer: BC Managed Care – PPO | Source: Ambulatory Visit | Attending: Family Medicine | Admitting: Family Medicine

## 2023-05-02 DIAGNOSIS — R928 Other abnormal and inconclusive findings on diagnostic imaging of breast: Secondary | ICD-10-CM

## 2024-02-24 ENCOUNTER — Other Ambulatory Visit: Payer: Self-pay | Admitting: Family Medicine

## 2024-02-24 DIAGNOSIS — Z1231 Encounter for screening mammogram for malignant neoplasm of breast: Secondary | ICD-10-CM

## 2024-04-22 ENCOUNTER — Ambulatory Visit
Admission: RE | Admit: 2024-04-22 | Discharge: 2024-04-22 | Disposition: A | Discharge status: Home / Self Care | Source: Ambulatory Visit | Attending: Family Medicine | Admitting: Family Medicine

## 2024-04-22 DIAGNOSIS — Z1231 Encounter for screening mammogram for malignant neoplasm of breast: Secondary | ICD-10-CM

## 2024-04-27 ENCOUNTER — Other Ambulatory Visit: Payer: Self-pay | Admitting: Family Medicine

## 2024-04-27 DIAGNOSIS — R928 Other abnormal and inconclusive findings on diagnostic imaging of breast: Secondary | ICD-10-CM

## 2024-04-29 ENCOUNTER — Encounter (HOSPITAL_COMMUNITY): Payer: Self-pay

## 2024-05-06 ENCOUNTER — Ambulatory Visit

## 2024-05-06 ENCOUNTER — Ambulatory Visit
Admission: RE | Admit: 2024-05-06 | Discharge: 2024-05-06 | Disposition: A | Discharge status: Home / Self Care | Source: Ambulatory Visit | Attending: Family Medicine | Admitting: Family Medicine

## 2024-05-06 DIAGNOSIS — R928 Other abnormal and inconclusive findings on diagnostic imaging of breast: Secondary | ICD-10-CM
